# Patient Record
Sex: Male | Born: 1970 | Hispanic: Yes | Marital: Single | Smoking: Light tobacco smoker
Health system: Southern US, Community
[De-identification: ages and names within clinical notes are randomized; demographics above are authoritative.]

## PROBLEM LIST (undated history)

## (undated) DIAGNOSIS — F419 Anxiety disorder, unspecified: Secondary | ICD-10-CM

## (undated) DIAGNOSIS — B20 Human immunodeficiency virus [HIV] disease: Principal | ICD-10-CM

## (undated) DIAGNOSIS — Z21 Asymptomatic human immunodeficiency virus [HIV] infection status: Secondary | ICD-10-CM

## (undated) DIAGNOSIS — Z227 Latent tuberculosis: Secondary | ICD-10-CM

## (undated) DIAGNOSIS — F4310 Post-traumatic stress disorder, unspecified: Secondary | ICD-10-CM

## (undated) DIAGNOSIS — L290 Pruritus ani: Principal | ICD-10-CM

## (undated) HISTORY — DX: Anxiety disorder, unspecified: F41.9

## (undated) HISTORY — DX: Human immunodeficiency virus (HIV) disease: B20

## (undated) HISTORY — DX: Post-traumatic stress disorder, unspecified: F43.10

## (undated) HISTORY — DX: Asymptomatic human immunodeficiency virus (hiv) infection status: Z21

## (undated) HISTORY — DX: Latent tuberculosis: Z22.7

## (undated) HISTORY — DX: Pruritus ani: L29.0

---

## 2012-09-24 DIAGNOSIS — R21 Rash and other nonspecific skin eruption: Secondary | ICD-10-CM | POA: Insufficient documentation

## 2012-09-24 DIAGNOSIS — B20 Human immunodeficiency virus [HIV] disease: Secondary | ICD-10-CM | POA: Insufficient documentation

## 2012-09-24 DIAGNOSIS — Z21 Asymptomatic human immunodeficiency virus [HIV] infection status: Secondary | ICD-10-CM | POA: Insufficient documentation

## 2013-04-19 DIAGNOSIS — A630 Anogenital (venereal) warts: Secondary | ICD-10-CM | POA: Insufficient documentation

## 2013-04-19 DIAGNOSIS — K648 Other hemorrhoids: Secondary | ICD-10-CM | POA: Insufficient documentation

## 2013-04-19 DIAGNOSIS — Z227 Latent tuberculosis: Secondary | ICD-10-CM | POA: Insufficient documentation

## 2013-08-02 ENCOUNTER — Telehealth: Payer: Self-pay

## 2013-08-02 NOTE — Telephone Encounter (Signed)
Referral received form [facility] stating patient would like to transfer to [place].  No demographic information sent.   Phone number in this medical record is invalid.   I will submit to [employer] [facility] .  Laurell Josephsammy K Alverda Nazzaro, RN

## 2013-08-24 DIAGNOSIS — L282 Other prurigo: Secondary | ICD-10-CM | POA: Insufficient documentation

## 2013-08-24 DIAGNOSIS — Z1389 Encounter for screening for other disorder: Secondary | ICD-10-CM | POA: Insufficient documentation

## 2014-08-29 ENCOUNTER — Other Ambulatory Visit: Payer: Self-pay | Admitting: Infectious Disease

## 2014-08-29 ENCOUNTER — Ambulatory Visit
Admission: RE | Admit: 2014-08-29 | Discharge: 2014-08-29 | Disposition: A | Discharge status: Home / Self Care | Payer: No Typology Code available for payment source | Source: Ambulatory Visit | Attending: Infectious Disease | Admitting: Infectious Disease

## 2014-08-29 DIAGNOSIS — Z111 Encounter for screening for respiratory tuberculosis: Secondary | ICD-10-CM

## 2014-09-21 ENCOUNTER — Telehealth: Payer: Self-pay

## 2014-09-21 NOTE — Telephone Encounter (Signed)
Referral from [facility] . Patient would like to [facility] .  Insured.  Appointment given for labs.   Laurell Josephsammy K King, RN

## 2014-09-23 ENCOUNTER — Other Ambulatory Visit: Payer: 59

## 2014-09-23 DIAGNOSIS — E785 Hyperlipidemia, unspecified: Secondary | ICD-10-CM

## 2014-09-23 DIAGNOSIS — A64 Unspecified sexually transmitted disease: Secondary | ICD-10-CM

## 2014-09-23 DIAGNOSIS — B20 Human immunodeficiency virus [HIV] disease: Secondary | ICD-10-CM

## 2014-09-23 LAB — T-HELPER CELL (CD4) - (RCID CLINIC ONLY)
CD4 % Helper T Cell: 39 % (ref 33–55)
CD4 T Cell Abs: 610 /uL (ref 400–2700)

## 2014-09-24 LAB — COMPLETE METABOLIC PANEL WITH GFR
ALT: 35 U/L (ref 0–53)
AST: 28 U/L (ref 0–37)
Albumin: 4.4 g/dL (ref 3.5–5.2)
Alkaline Phosphatase: 82 U/L (ref 39–117)
BUN: 18 mg/dL (ref 6–23)
CO2: 28 mEq/L (ref 19–32)
Calcium: 9.5 mg/dL (ref 8.4–10.5)
Chloride: 102 mEq/L (ref 96–112)
Creat: 1 mg/dL (ref 0.50–1.35)
GFR, Est African American: >89 mL/min
GFR, Est Non African American: >89 mL/min
Glucose, Bld: 122 mg/dL — ABNORMAL HIGH (ref 70–99)
Potassium: 4.1 mEq/L (ref 3.5–5.3)
Sodium: 139 mEq/L (ref 135–145)
Total Bilirubin: 0.5 mg/dL (ref 0.2–1.2)
Total Protein: 7.2 g/dL (ref 6.0–8.3)

## 2014-09-24 LAB — CBC WITH DIFFERENTIAL/PLATELET
Basophils Absolute: 0 10*3/uL (ref 0.0–0.1)
Basophils Relative: 0 % (ref 0–1)
Eosinophils Absolute: 0.1 10*3/uL (ref 0.0–0.7)
Eosinophils Relative: 2 % (ref 0–5)
HCT: 47.2 % (ref 39.0–52.0)
Hemoglobin: 16.2 g/dL (ref 13.0–17.0)
Lymphocytes Relative: 29 % (ref 12–46)
Lymphs Abs: 1.5 10*3/uL (ref 0.7–4.0)
MCH: 32.3 pg (ref 26.0–34.0)
MCHC: 34.3 g/dL (ref 30.0–36.0)
MCV: 94 fL (ref 78.0–100.0)
MPV: 11.7 fL (ref 8.6–12.4)
Monocytes Absolute: 0.3 10*3/uL (ref 0.1–1.0)
Monocytes Relative: 6 % (ref 3–12)
Neutro Abs: 3.3 10*3/uL (ref 1.7–7.7)
Neutrophils Relative %: 63 % (ref 43–77)
Platelets: 202 10*3/uL (ref 150–400)
RBC: 5.02 MIL/uL (ref 4.22–5.81)
RDW: 14.4 % (ref 11.5–15.5)
WBC: 5.2 10*3/uL (ref 4.0–10.5)

## 2014-09-24 LAB — HEPATITIS C ANTIBODY: HCV Ab: NEGATIVE

## 2014-09-24 LAB — HEPATITIS B SURFACE ANTIBODY,QUALITATIVE: Hep B S Ab: NEGATIVE

## 2014-09-24 LAB — URINALYSIS
Bilirubin Urine: NEGATIVE
Glucose, UA: NEGATIVE mg/dL
Hgb urine dipstick: NEGATIVE
Ketones, ur: NEGATIVE mg/dL
Leukocytes, UA: NEGATIVE
Nitrite: NEGATIVE
Protein, ur: NEGATIVE mg/dL
Specific Gravity, Urine: 1.025 (ref 1.005–1.030)
Urobilinogen, UA: 0.2 mg/dL (ref 0.0–1.0)
pH: 6 (ref 5.0–8.0)

## 2014-09-24 LAB — LIPID PANEL
Cholesterol: 185 mg/dL (ref 0–200)
HDL: 39 mg/dL — ABNORMAL LOW (ref >=40)
LDL Cholesterol: 100 mg/dL — ABNORMAL HIGH (ref 0–99)
Total CHOL/HDL Ratio: 4.7 Ratio
Triglycerides: 229 mg/dL — ABNORMAL HIGH (ref <150)
VLDL: 46 mg/dL — ABNORMAL HIGH (ref 0–40)

## 2014-09-24 LAB — HEPATITIS B SURFACE ANTIGEN: Hepatitis B Surface Ag: NEGATIVE

## 2014-09-24 LAB — HEPATITIS A ANTIBODY, TOTAL: Hep A Total Ab: REACTIVE — AB

## 2014-09-24 LAB — RPR

## 2014-09-24 LAB — HEPATITIS B CORE ANTIBODY, TOTAL: Hep B Core Total Ab: NONREACTIVE

## 2014-09-25 LAB — QUANTIFERON TB GOLD ASSAY (BLOOD)
Interferon Gamma Release Assay: POSITIVE — AB
Mitogen value: 6.24 IU/mL
Quantiferon Nil Value: 0.1 IU/mL
Quantiferon Tb Ag Minus Nil Value: 0.46 IU/mL
TB Ag value: 0.56 IU/mL

## 2014-09-26 LAB — URINE CYTOLOGY ANCILLARY ONLY
Chlamydia: NEGATIVE
Neisseria Gonorrhea: NEGATIVE

## 2014-09-26 LAB — HIV-1 RNA ULTRAQUANT REFLEX TO GENTYP+
HIV 1 RNA Quant: <20 copies/mL (ref <20)
HIV-1 RNA Quant, Log: <1.3 {Log} (ref <1.30)

## 2014-09-29 LAB — HLA B*5701: HLA-B*5701 w/rflx HLA-B High: NEGATIVE

## 2014-10-13 ENCOUNTER — Encounter: Payer: Self-pay | Admitting: *Deleted

## 2014-10-13 ENCOUNTER — Encounter: Payer: Self-pay | Admitting: Infectious Disease

## 2014-10-13 ENCOUNTER — Ambulatory Visit (INDEPENDENT_AMBULATORY_CARE_PROVIDER_SITE_OTHER): Payer: 59 | Admitting: Infectious Disease

## 2014-10-13 VITALS — BP 137/77 | HR 84 | Temp 98.1°F | Ht 69.0 in | Wt 160.1 lb

## 2014-10-13 DIAGNOSIS — R7611 Nonspecific reaction to tuberculin skin test without active tuberculosis: Secondary | ICD-10-CM | POA: Diagnosis not present

## 2014-10-13 DIAGNOSIS — Z21 Asymptomatic human immunodeficiency virus [HIV] infection status: Secondary | ICD-10-CM

## 2014-10-13 DIAGNOSIS — B20 Human immunodeficiency virus [HIV] disease: Secondary | ICD-10-CM

## 2014-10-13 DIAGNOSIS — Z227 Latent tuberculosis: Secondary | ICD-10-CM

## 2014-10-13 HISTORY — DX: Latent tuberculosis: Z22.7

## 2014-10-13 MED ORDER — ELVITEG-COBIC-EMTRICIT-TENOFAF 150-150-200-10 MG PO TABS: 1.0000 | ORAL_TABLET | Freq: Every day | ORAL | Status: DC

## 2014-10-13 NOTE — Progress Notes (Signed)
   Subjective:    Patient ID: Ricky Holt, male    DOB: 1970-08-02, 44 y.o.   MRN: [id number]  HPI Ricky Holt is a 44 y/o male with HIV (dx in 2002) who is here to transfer care [facility]. He has been taking Stribild for the past two years and has tolerated this well. His labs from May show that he is undetectable with a CD4 count of 610. His testing on intake into our clinic revealed a positive gold quantiferon TB test. He states he is aware of this and has been treated for this by [place] [facility]. He denies any symptoms of TB and had sputum tests and a chest xray in [place] in October after his TB treatment as part of his Visa requirements. His partner [name] states all of these tests were negative.    Review of Systems  Constitutional: Negative for fever, chills, fatigue and unexpected weight change.  HENT: Negative.   Eyes: Negative.   Respiratory: Negative for cough and shortness of breath.   Gastrointestinal: Negative.   Endocrine: Negative.   Genitourinary: Negative.   Musculoskeletal: Negative.   Allergic/Immunologic: Negative.   Neurological: Negative.   Psychiatric/Behavioral: Negative for suicidal ideas.       Objective:   Physical Exam  Lab Results  Component Value Date   CD4TABS 610 09/23/2014     Lab Results  Component Value Date   HIV1RNAQUANT <20 09/23/2014      Assessment & Plan:  HIV:  Will call in script for Genvoya. If insurance denies this he will continue with Stribild. Follow up in 3 months   Latent TB: [facility] No symptoms of active TB

## 2014-10-13 NOTE — Progress Notes (Signed)
   Subjective:    Patient ID: Ricky Holt, male    DOB: 1971/03/27, 44 y.o.   MRN: [id number]  HPI  For details please see NP Student Lauren O'Neill's note.  Ricky Holt is 44 yo husband of one of my other patients. [place] and wishes to transfer his care here. He was followed for greater than 13 years [facility].  [facility] he was on [place] and [place]uvada and he has record of extremely high compliance.  He has prior PPD + and hx of rx for LTB by [facility] but having difficulties after having come back from [place] per pt and husband with being forced to stay in [place] until 3 sputa negative and now being tested by [facility] again with QF gold?? And called over phone.   Review of Systems  Constitutional: Negative for fever, chills, diaphoresis, activity change, appetite change, fatigue and unexpected weight change.  HENT: Negative for congestion, rhinorrhea, sinus pressure, sneezing, sore throat and trouble swallowing.   Eyes: Negative for photophobia and visual disturbance.  Respiratory: Negative for cough, chest tightness, shortness of breath, wheezing and stridor.   Cardiovascular: Negative for chest pain, palpitations and leg swelling.  Gastrointestinal: Negative for nausea, vomiting, abdominal pain, diarrhea, constipation, blood in stool, abdominal distention and anal bleeding.  Genitourinary: Negative for dysuria, hematuria, flank pain and difficulty urinating.  Musculoskeletal: Negative for myalgias, back pain, joint swelling, arthralgias and gait problem.  Skin: Negative for color change, pallor, rash and wound.  Neurological: Negative for dizziness, tremors, weakness and light-headedness.  Hematological: Negative for adenopathy. Does not bruise/bleed easily.  Psychiatric/Behavioral: Negative for behavioral problems, confusion, sleep disturbance, dysphoric mood, decreased concentration and agitation.       Objective:   Physical Exam  Constitutional: He is oriented to person, place, and time. He appears well-developed and well-nourished.  HENT:  Head: Normocephalic and atraumatic.  Eyes: Conjunctivae and EOM are normal.  Neck: Normal range of motion. Neck supple.  Cardiovascular: Normal rate and regular rhythm.   Pulmonary/Chest: Effort normal. No respiratory distress. He has no wheezes.  Abdominal: Soft. He exhibits no distension.  Musculoskeletal: Normal range of motion. He exhibits no edema or tenderness.  Neurological: He is alert and oriented to person, place, and time.  Skin: Skin is warm and dry. No rash noted. No erythema. No pallor.  Psychiatric: He has a normal mood and affect. His behavior is normal. Judgment and thought content normal.          Assessment & Plan:   HIV: change to Genvoya if is covered by his insurance  LTB: has been treated. I dont understand why anywone would EVER recheck QF gold on him  We spent greater than 45  minutes with the patient including greater than 50% of time in face to face counsel of the patient re his HIV and LTB and in coordination of their care.

## 2014-11-29 ENCOUNTER — Encounter (HOSPITAL_COMMUNITY): Payer: Self-pay | Admitting: Emergency Medicine

## 2014-11-29 ENCOUNTER — Emergency Department (HOSPITAL_COMMUNITY)
Admission: EM | Admit: 2014-11-29 | Discharge: 2014-11-29 | Disposition: A | Discharge status: Home / Self Care | Payer: 59 | Source: Home / Self Care | Attending: Family Medicine | Admitting: Family Medicine

## 2014-11-29 ENCOUNTER — Emergency Department (INDEPENDENT_AMBULATORY_CARE_PROVIDER_SITE_OTHER): Payer: 59

## 2014-11-29 DIAGNOSIS — J069 Acute upper respiratory infection, unspecified: Secondary | ICD-10-CM

## 2014-11-29 HISTORY — DX: Asymptomatic human immunodeficiency virus (hiv) infection status: Z21

## 2014-11-29 HISTORY — DX: Human immunodeficiency virus (HIV) disease: B20

## 2014-11-29 MED ORDER — AZITHROMYCIN 250 MG PO TABS: ORAL_TABLET | ORAL | Status: DC

## 2014-11-29 NOTE — ED Notes (Addendum)
Delay in triage assessment secondary to department acuity.

## 2014-11-29 NOTE — ED Notes (Signed)
Patient transported to X-ray 

## 2014-11-29 NOTE — Discharge Instructions (Signed)
Drink plenty of fluids as discussed, use medicine as prescribed, and mucinex or delsym for cough. Return or see your doctor if further problems, see your doctor in 3 mos for repeat chest x-ray.

## 2014-11-29 NOTE — ED Provider Notes (Signed)
CSN: [id number]     Arrival date & time 11/29/14  1308 History   First MD Initiated Contact with Patient 11/29/14 1331     Chief Complaint  Patient presents with  . Cough   (Consider location/radiation/quality/duration/timing/severity/associated sxs/prior Treatment) Patient is a 44 y.o. male presenting with URI. The history is provided by the patient.  URI Presenting symptoms: congestion, cough, fever and rhinorrhea   Severity:  Moderate Progression:  Unchanged Chronicity:  New Associated symptoms: no wheezing     Past Medical History  Diagnosis Date  . 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 HIV infection   . HIV (human immunodeficiency virus infection)    History reviewed. No pertinent past surgical history. No family history on file. History  Substance Use Topics  . Smoking status: Never Smoker   . Smokeless tobacco: Not on file  . Alcohol Use: 0.0 oz/week    0 Standard drinks or equivalent per week    Review of Systems  Constitutional: Positive for fever.  HENT: Positive for congestion and rhinorrhea.   Respiratory: Positive for cough. Negative for shortness of breath and wheezing.     Allergies  Review of patient's allergies indicates no known allergies.  Home Medications   Prior to Admission medications   Medication Sig Start Date End Date Taking? Authorizing Provider  Efavirenz (SUSTIVA PO) Take by mouth.   Yes Historical Provider, MD  OVER THE COUNTER MEDICATION otc cold medicine   Yes Historical Provider, MD  ValACYclovir HCl (VALTREX PO) Take by mouth.   Yes Historical Provider, MD  azithromycin (ZITHROMAX Z-PAK) 250 MG tablet Take as directed on pack 11/29/14   Linna HoffJames D Shivan Hodes, MD  elvitegravir-cobicistat-emtricitabine-tenofovir (GENVOYA) 150-150-200-10 MG TABS tablet Take 1 tablet by mouth daily with breakfast. 10/13/14   Randall Hissornelius N Van Dam, MD  multivitamin-iron-minerals-folic acid (CENTRUM) chewable tablet Chew 1 tablet by mouth daily.    Historical Provider, MD   omeprazole (PRILOSEC) 20 MG capsule Take 20 mg by mouth daily.    Historical Provider, MD  valACYclovir (VALTREX) 500 MG tablet Take 500 mg by mouth. 03/07/14   Historical Provider, MD   BP 118/73 mmHg  Pulse 73  Temp(Src) 99.6 F (37.6 C) (Oral)  Resp 16  SpO2 97% Physical Exam  Constitutional: He is oriented to person, place, and time. He appears well-developed and well-nourished.  HENT:  Mouth/Throat: Oropharynx is clear and moist.  Eyes: Conjunctivae are normal. Pupils are equal, round, and reactive to light.  Neck: Normal range of motion. Neck supple.  Cardiovascular: Normal rate, normal heart sounds and intact distal pulses.   Pulmonary/Chest: Effort normal and breath sounds normal. He has no rales.  Lymphadenopathy:    He has no cervical adenopathy.  Neurological: He is alert and oriented to person, place, and time.  Skin: Skin is warm and dry.  Nursing note and vitals reviewed.   ED Course  Procedures (including critical care time) Labs Review Labs Reviewed - No data to display  Imaging Review Dg Chest 2 View  11/29/2014   CLINICAL DATA:  Dry cough for 3 days and fever.  Smoking history.  EXAM: CHEST  2 VIEW  COMPARISON:  08/29/2014  FINDINGS: The cardiac silhouette, mediastinal and hilar contours are stable. Asymmetric density in the right infrahilar region may be overlying vascular markings. The lungs are clear. No pleural effusion. The bony thorax is intact.  IMPRESSION: 1. No acute cardiopulmonary findings. 2. Slight prominence of the right infrahilar region not significantly changed since April but I would  recommend a follow-up chest x-ray in 3-4 months to reassess this.   Electronically Signed   By: Rudie Meyer M.D.   On: 11/29/2014 14:48    X-rays reviewed and report per radiologist.  MDM   1. URI (upper respiratory infection)        Linna Hoff, MD 11/30/14 2029

## 2014-11-29 NOTE — ED Notes (Addendum)
Patient has a cough since 7/5.  Has been taking otc cold medicines

## 2014-11-30 ENCOUNTER — Encounter (HOSPITAL_COMMUNITY): Payer: Self-pay | Admitting: Emergency Medicine

## 2015-01-15 ENCOUNTER — Ambulatory Visit (INDEPENDENT_AMBULATORY_CARE_PROVIDER_SITE_OTHER): Payer: 59

## 2015-01-15 ENCOUNTER — Ambulatory Visit (INDEPENDENT_AMBULATORY_CARE_PROVIDER_SITE_OTHER): Payer: 59 | Admitting: Emergency Medicine

## 2015-01-15 VITALS — BP 118/72 | HR 76 | Temp 98.0°F | Resp 16 | Ht 70.0 in | Wt 155.2 lb

## 2015-01-15 DIAGNOSIS — R05 Cough: Secondary | ICD-10-CM | POA: Diagnosis not present

## 2015-01-15 DIAGNOSIS — J189 Pneumonia, unspecified organism: Secondary | ICD-10-CM | POA: Diagnosis not present

## 2015-01-15 DIAGNOSIS — R058 Other specified cough: Secondary | ICD-10-CM

## 2015-01-15 MED ORDER — HYDROCOD POLST-CPM POLST ER 10-8 MG/5ML PO SUER
5.0000 mL | Freq: Every evening | ORAL | Status: DC | PRN
Start: 2015-01-15 — End: 2015-02-08

## 2015-01-15 MED ORDER — BENZONATATE 100 MG PO CAPS: 100.0000 mg | ORAL_CAPSULE | Freq: Three times a day (TID) | ORAL | Status: DC | PRN

## 2015-01-15 MED ORDER — LEVOFLOXACIN 500 MG PO TABS: 500.0000 mg | ORAL_TABLET | Freq: Every day | ORAL | Status: AC

## 2015-01-15 NOTE — Progress Notes (Signed)
[facility] [address] [place] [address] [phone]  Date:  01/15/2015   Name:  Ricky Holt   DOB:  June 28, 1970   MRN:  [id number]  PCP:  No primary care provider on file.    History of Present Illness:  Lee Kuang is a 44 y.o. male patient with PMH of latent tuberculosis and HIV who presents to [facility] with chief complaint of cough for about 1 week.  It is a productive green sputum.  He initially had sorethroat, rhinorrhea of clear sputum, and some nasal congestion.  He has no fever, bodyaches, or fatigue.  He only has dyspnea with coughing fits, but otherwise normal respiratory.  No ear discomfort.  No sick contacts.  No abdominal pain, nausea, or vomiting.   Treated by HD for tuberculosis 8 years ago.   Last cd4=610, with quant at <20.  He is compliant on his medication.         Patient Active Problem List   Diagnosis Date Noted  . 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 Multiphasic screening 08/24/2013  . 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 Hemorrhoids, internal 04/19/2013  . 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 HIV (human immunodeficiency virus infection) 09/24/2012  . Cutaneous eruption 09/24/2012    Past Medical History  Diagnosis Date  . 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 HIV infection   . HIV (human immunodeficiency virus infection)     History reviewed. No pertinent past surgical history.  Social History  Substance Use Topics  . Smoking status: Never Smoker   . Smokeless tobacco: None  . Alcohol Use: 0.0 oz/week    0 Standard drinks or equivalent per week    History reviewed. No pertinent family history.  No Known Allergies  Medication list has been reviewed and updated.  Current Outpatient Prescriptions on File Prior to Visit  Medication Sig Dispense Refill  . elvitegravir-cobicistat-emtricitabine-tenofovir (GENVOYA) 150-150-200-10 MG TABS tablet Take 1 tablet by  mouth daily with breakfast. 30 tablet 11  . multivitamin-iron-minerals-folic acid (CENTRUM) chewable tablet Chew 1 tablet by mouth daily.    Marland Kitchen OVER THE COUNTER MEDICATION otc cold medicine    . ValACYclovir HCl (VALTREX PO) Take by mouth.     No current facility-administered medications on file prior to visit.    ROS ROS otherwise unremarkable unless listed above.  Physical Examination: BP 118/72 mmHg  Pulse 76  Temp(Src) 98 F (36.7 C) (Oral)  Resp 16  Ht 5\' 10"  (1.778 m)  Wt 155 lb 3.2 oz (70.398 kg)  BMI 22.27 kg/m2  SpO2 76% Ideal Body Weight: Weight in (lb) to have BMI = 25: 173.9  Physical Exam  Constitutional: He is oriented to person, place, and time. He appears well-developed and well-nourished. No distress.  HENT:  Head: Normocephalic and atraumatic.  Right Ear: Tympanic membrane, external ear and ear canal normal.  Left Ear: Tympanic membrane, external ear and ear canal normal.  Eyes: Conjunctivae are normal. Right eye exhibits no discharge. Left eye exhibits no discharge. No scleral icterus.  Cardiovascular: Normal rate, regular rhythm and intact distal pulses.  Exam reveals no friction rub.   No murmur heard. Pulmonary/Chest: Effort normal. No respiratory distress. He has wheezes (Very mild wheezng at end of expiration in the left lower base, but otherwise good air circulation).  Abdominal: Soft.  Lymphadenopathy:    He has no cervical adenopathy.  Neurological: He is alert and oriented to person, place, and time.  Skin:  Skin is warm and dry. He is not diaphoretic.  Psychiatric: He has a normal mood and affect. His behavior is normal.    [facility] reading (PRIMARY) by  Dr. Cleta Alberts: Increased markings.  Patchy infiltrates.   Assessment and Plan: 44 year old male is here today for chief complaint of productive cough.  Treating for cap with levaquin given co-mords.  Treating cough supportively.  Advised hydration and otc mucinex.   CAP (community acquired pneumonia) -  Plan: levofloxacin (LEVAQUIN) 500 MG tablet, chlorpheniramine-HYDROcodone (TUSSIONEX PENNKINETIC ER) 10-8 MG/5ML SUER  Productive cough - Plan: DG Chest 2 View, benzonatate (TESSALON) 100 MG capsule  Trena Platt, PA-C [facility] 8/29/201610:32 PM

## 2015-01-15 NOTE — Patient Instructions (Signed)

## 2015-01-25 ENCOUNTER — Other Ambulatory Visit: Payer: 59

## 2015-01-25 DIAGNOSIS — B20 Human immunodeficiency virus [HIV] disease: Secondary | ICD-10-CM

## 2015-01-25 DIAGNOSIS — Z21 Asymptomatic human immunodeficiency virus [HIV] infection status: Secondary | ICD-10-CM

## 2015-01-25 LAB — CBC WITH DIFFERENTIAL/PLATELET
Basophils Absolute: 0.1 10*3/uL (ref 0.0–0.1)
Basophils Relative: 1 % (ref 0–1)
Eosinophils Absolute: 0.2 10*3/uL (ref 0.0–0.7)
Eosinophils Relative: 3 % (ref 0–5)
HCT: 46.7 % (ref 39.0–52.0)
Hemoglobin: 16.5 g/dL (ref 13.0–17.0)
Lymphocytes Relative: 29 % (ref 12–46)
Lymphs Abs: 1.7 10*3/uL (ref 0.7–4.0)
MCH: 33.2 pg (ref 26.0–34.0)
MCHC: 35.3 g/dL (ref 30.0–36.0)
MCV: 94 fL (ref 78.0–100.0)
MPV: 11.9 fL (ref 8.6–12.4)
Monocytes Absolute: 0.4 10*3/uL (ref 0.1–1.0)
Monocytes Relative: 7 % (ref 3–12)
Neutro Abs: 3.4 10*3/uL (ref 1.7–7.7)
Neutrophils Relative %: 60 % (ref 43–77)
Platelets: 214 10*3/uL (ref 150–400)
RBC: 4.97 MIL/uL (ref 4.22–5.81)
RDW: 14.8 % (ref 11.5–15.5)
WBC: 5.7 10*3/uL (ref 4.0–10.5)

## 2015-01-25 LAB — COMPLETE METABOLIC PANEL WITH GFR
ALT: 17 U/L (ref 9–46)
AST: 20 U/L (ref 10–40)
Albumin: 4.3 g/dL (ref 3.6–5.1)
Alkaline Phosphatase: 80 U/L (ref 40–115)
BUN: 17 mg/dL (ref 7–25)
CO2: 25 mmol/L (ref 20–31)
Calcium: 9.6 mg/dL (ref 8.6–10.3)
Chloride: 102 mmol/L (ref 98–110)
Creat: 0.94 mg/dL (ref 0.60–1.35)
GFR, Est African American: >89 mL/min (ref >=60)
GFR, Est Non African American: >89 mL/min (ref >=60)
Glucose, Bld: 159 mg/dL — ABNORMAL HIGH (ref 65–99)
Potassium: 4.4 mmol/L (ref 3.5–5.3)
Sodium: 140 mmol/L (ref 135–146)
Total Bilirubin: 0.4 mg/dL (ref 0.2–1.2)
Total Protein: 6.7 g/dL (ref 6.1–8.1)

## 2015-01-25 LAB — LIPID PANEL
Cholesterol: 255 mg/dL — ABNORMAL HIGH (ref 125–200)
HDL: 33 mg/dL — ABNORMAL LOW (ref >=40)
Total CHOL/HDL Ratio: 7.7 Ratio — ABNORMAL HIGH (ref <=5.0)
Triglycerides: 1190 mg/dL — ABNORMAL HIGH (ref <150)

## 2015-01-26 LAB — T-HELPER CELL (CD4) - (RCID CLINIC ONLY)
CD4 % Helper T Cell: 41 % (ref 33–55)
CD4 T Cell Abs: 740 /uL (ref 400–2700)

## 2015-01-26 LAB — RPR

## 2015-01-29 LAB — HIV-1 RNA QUANT-NO REFLEX-BLD
HIV 1 RNA Quant: <20 copies/mL (ref <20)
HIV-1 RNA Quant, Log: <1.3 {Log} (ref <1.30)

## 2015-02-08 ENCOUNTER — Ambulatory Visit (INDEPENDENT_AMBULATORY_CARE_PROVIDER_SITE_OTHER): Payer: 59 | Admitting: Infectious Disease

## 2015-02-08 ENCOUNTER — Encounter: Payer: Self-pay | Admitting: Infectious Disease

## 2015-02-08 VITALS — BP 127/87 | HR 75 | Temp 98.5°F | Wt 159.0 lb

## 2015-02-08 DIAGNOSIS — A630 Anogenital (venereal) warts: Secondary | ICD-10-CM | POA: Diagnosis not present

## 2015-02-08 DIAGNOSIS — Z23 Encounter for immunization: Secondary | ICD-10-CM | POA: Diagnosis not present

## 2015-02-08 DIAGNOSIS — Z21 Asymptomatic human immunodeficiency virus [HIV] infection status: Secondary | ICD-10-CM | POA: Diagnosis not present

## 2015-02-08 DIAGNOSIS — L290 Pruritus ani: Secondary | ICD-10-CM | POA: Diagnosis not present

## 2015-02-08 DIAGNOSIS — K648 Other hemorrhoids: Secondary | ICD-10-CM

## 2015-02-08 DIAGNOSIS — B20 Human immunodeficiency virus [HIV] disease: Secondary | ICD-10-CM

## 2015-02-08 DIAGNOSIS — R7611 Nonspecific reaction to tuberculin skin test without active tuberculosis: Secondary | ICD-10-CM | POA: Diagnosis not present

## 2015-02-08 DIAGNOSIS — Z227 Latent tuberculosis: Secondary | ICD-10-CM

## 2015-02-08 HISTORY — DX: Pruritus ani: L29.0

## 2015-02-08 MED ORDER — HYDROCORTISONE ACETATE 25 MG RE SUPP: 25.0000 mg | Freq: Two times a day (BID) | RECTAL | Status: DC

## 2015-02-08 NOTE — Progress Notes (Signed)
Chief complaint: itching in his anal and perianal area  Subjective:    Patient ID: Ricky Holt, male    DOB: 1971/04/22, 44 y.o.   MRN: [id number]  HPI  Ricky Holt is 46 yo husband of one of my other patients. He has moved to [facility] and wishes to transfer his care here. He was followed for greater than 13 years by Ricky Holt at [place].  Prior to switch to [facility] he was on [place] and he has record of extremely high compliance.  He has prior PPD + and hx of rx for LTB by [facility]. We changed him to [facility] but I am not sure if he is on this or STRIBILD.  He returns to clinic for followup. 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 He has has no discharge from rectum. He has no rectal pain. He has tried several OTC meds including steroids without reflief.  Past Medical History  Diagnosis Date  . 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 HIV infection   . HIV (human immunodeficiency virus infection)     No past surgical history on file.  No family history on file. No hx of connective tissue disease or IBD  Social History  Substance Use Topics  . Smoking status: Light Tobacco Smoker    Types: Cigarettes  . Smokeless tobacco: None     Comment: 1 cigarette daily  . Alcohol Use: 0.0 oz/week    0 Standard drinks or equivalent per week   No Known Allergies   Current outpatient prescriptions:  .  elvitegravir-cobicistat-emtricitabine-tenofovir (GENVOYA) 150-150-200-10 MG TABS tablet, Take 1 tablet by mouth daily with breakfast., Disp: 30 tablet, Rfl: 11 .  multivitamin-iron-minerals-folic acid (CENTRUM) chewable tablet, Chew 1 tablet by mouth daily., Disp: , Rfl:  .  ranitidine (ZANTAC) 75 MG tablet, Take 75 mg by mouth 2 (two) times daily., Disp: , Rfl:  .  ValACYclovir HCl (VALTREX PO), Take by mouth., Disp: , Rfl:  .  hydrocortisone (ANUSOL-HC) 25 MG suppository, Place 1  suppository (25 mg total) rectally 2 (two) times daily., Disp: 12 suppository, Rfl: 1   Review of Systems  Constitutional: Negative for fever, chills, diaphoresis, activity change, appetite change, fatigue and unexpected weight change.  HENT: Negative for congestion, rhinorrhea, sinus pressure, sneezing, sore throat and trouble swallowing.   Eyes: Negative for photophobia and visual disturbance.  Respiratory: Negative for cough, chest tightness, shortness of breath, wheezing and stridor.   Cardiovascular: Negative for chest pain, palpitations and leg swelling.  Gastrointestinal: Negative for nausea, vomiting, abdominal pain, diarrhea, constipation, blood in stool, abdominal distention and anal bleeding.  Genitourinary: Negative for dysuria, urgency, hematuria, flank pain, decreased urine volume, discharge, penile swelling, scrotal swelling, difficulty urinating, penile pain and testicular pain.  Musculoskeletal: Negative for myalgias, back pain, joint swelling, arthralgias and gait problem.  Skin: Positive for color change. Negative for pallor, rash and wound.  Neurological: Negative for dizziness, tremors, weakness and light-headedness.  Hematological: Negative for adenopathy. Does not bruise/bleed easily.  Psychiatric/Behavioral: Negative for behavioral problems, confusion, sleep disturbance, dysphoric mood, decreased concentration and agitation.       Objective:   Physical Exam  Constitutional: He is oriented to person, place, and time. He appears well-developed and well-nourished.  HENT:  Head: Normocephalic and atraumatic.  Eyes: Conjunctivae and EOM are normal.  Neck: Normal range of motion. Neck supple.  Cardiovascular: Normal rate and regular rhythm.   Pulmonary/Chest: Effort  normal. No respiratory distress. He has no wheezes.  Abdominal: Soft. He exhibits no distension.  Genitourinary: Rectal exam shows external hemorrhoid.     Musculoskeletal: Normal range of motion. He  exhibits no edema or tenderness.  Neurological: He is alert and oriented to person, place, and time.  Skin: Skin is warm and dry. No rash noted. No erythema. No pallor.  Psychiatric: He has a normal mood and affect. His behavior is normal. Judgment and thought content normal.          Assessment & Plan:   New problem of perirectal pruritis: could be yeast infection in this area prone to moisture where he also sweats during work. Could also be an STD and therefore will check GC and chlamydia probe  HIV disease: perfect compliance, labs excellent, RTC in 4 months  LTB: sp treatment  I spent greater than 25 minutes with the patient including greater than 50% of time in face to face counsel of the patient re his HIV, his ARV regimen, his anal pruritis and in coordination of their care.

## 2015-02-08 NOTE — Patient Instructions (Signed)
Try LOTRIMIN (clotrimazole) antifungal in the area that is itching, twice daily  Not in the rectum  If not better in 1-2 weeks call us back

## 2015-02-09 LAB — CYTOLOGY, (ORAL, ANAL, URETHRAL) ANCILLARY ONLY
Chlamydia: NEGATIVE
Neisseria Gonorrhea: NEGATIVE

## 2015-02-14 ENCOUNTER — Other Ambulatory Visit: Payer: Self-pay | Admitting: Infectious Disease

## 2015-02-14 MED ORDER — VALACYCLOVIR HCL 500 MG PO TABS: 500.0000 mg | ORAL_TABLET | Freq: Every day | ORAL | Status: DC

## 2015-03-28 ENCOUNTER — Encounter (HOSPITAL_COMMUNITY): Payer: Self-pay | Admitting: Emergency Medicine

## 2015-03-28 ENCOUNTER — Observation Stay (HOSPITAL_COMMUNITY)
Admission: EM | Admit: 2015-03-28 | Discharge: 2015-03-29 | Disposition: A | Discharge status: Home / Self Care | Payer: 59 | Attending: General Surgery | Admitting: General Surgery

## 2015-03-28 ENCOUNTER — Emergency Department (HOSPITAL_COMMUNITY): Payer: 59

## 2015-03-28 ENCOUNTER — Observation Stay (HOSPITAL_COMMUNITY): Payer: 59

## 2015-03-28 DIAGNOSIS — F17210 Nicotine dependence, cigarettes, uncomplicated: Secondary | ICD-10-CM | POA: Insufficient documentation

## 2015-03-28 DIAGNOSIS — S0181XA Laceration without foreign body of other part of head, initial encounter: Principal | ICD-10-CM | POA: Diagnosis present

## 2015-03-28 DIAGNOSIS — J342 Deviated nasal septum: Secondary | ICD-10-CM | POA: Insufficient documentation

## 2015-03-28 DIAGNOSIS — M25511 Pain in right shoulder: Secondary | ICD-10-CM | POA: Insufficient documentation

## 2015-03-28 DIAGNOSIS — S02641A Fracture of ramus of right mandible, initial encounter for closed fracture: Secondary | ICD-10-CM | POA: Insufficient documentation

## 2015-03-28 DIAGNOSIS — S0240FA Zygomatic fracture, left side, initial encounter for closed fracture: Secondary | ICD-10-CM | POA: Insufficient documentation

## 2015-03-28 DIAGNOSIS — S0291XA Unspecified fracture of skull, initial encounter for closed fracture: Secondary | ICD-10-CM

## 2015-03-28 DIAGNOSIS — S060X9A Concussion with loss of consciousness of unspecified duration, initial encounter: Secondary | ICD-10-CM | POA: Insufficient documentation

## 2015-03-28 DIAGNOSIS — R51 Headache: Secondary | ICD-10-CM | POA: Insufficient documentation

## 2015-03-28 DIAGNOSIS — Z79899 Other long term (current) drug therapy: Secondary | ICD-10-CM

## 2015-03-28 DIAGNOSIS — S060XAA Concussion with loss of consciousness status unknown, initial encounter: Secondary | ICD-10-CM | POA: Diagnosis present

## 2015-03-28 DIAGNOSIS — Z8611 Personal history of tuberculosis: Secondary | ICD-10-CM | POA: Diagnosis not present

## 2015-03-28 DIAGNOSIS — B20 Human immunodeficiency virus [HIV] disease: Secondary | ICD-10-CM | POA: Insufficient documentation

## 2015-03-28 DIAGNOSIS — S0081XA Abrasion of other part of head, initial encounter: Secondary | ICD-10-CM | POA: Insufficient documentation

## 2015-03-28 DIAGNOSIS — S0282XA Fracture of other specified skull and facial bones, left side, initial encounter for closed fracture: Secondary | ICD-10-CM | POA: Insufficient documentation

## 2015-03-28 DIAGNOSIS — M25512 Pain in left shoulder: Secondary | ICD-10-CM

## 2015-03-28 DIAGNOSIS — S0292XA Unspecified fracture of facial bones, initial encounter for closed fracture: Secondary | ICD-10-CM | POA: Diagnosis present

## 2015-03-28 LAB — I-STAT CHEM 8, ED
BUN: 18 mg/dL (ref 6–20)
Calcium, Ion: 1.12 mmol/L (ref 1.12–1.23)
Chloride: 100 mmol/L — ABNORMAL LOW (ref 101–111)
Creatinine, Ser: 0.9 mg/dL (ref 0.61–1.24)
Glucose, Bld: 149 mg/dL — ABNORMAL HIGH (ref 65–99)
HCT: 52 % (ref 39.0–52.0)
Hemoglobin: 17.7 g/dL — ABNORMAL HIGH (ref 13.0–17.0)
Potassium: 3.7 mmol/L (ref 3.5–5.1)
Sodium: 140 mmol/L (ref 135–145)
TCO2: 23 mmol/L (ref 0–100)

## 2015-03-28 LAB — CBC WITH DIFFERENTIAL/PLATELET
Basophils Absolute: 0 10*3/uL (ref 0.0–0.1)
Basophils Relative: 0 %
Eosinophils Absolute: 0.2 10*3/uL (ref 0.0–0.7)
Eosinophils Relative: 2 %
HCT: 47.4 % (ref 39.0–52.0)
Hemoglobin: 17.1 g/dL — ABNORMAL HIGH (ref 13.0–17.0)
Lymphocytes Relative: 34 %
Lymphs Abs: 2.5 10*3/uL (ref 0.7–4.0)
MCH: 33.5 pg (ref 26.0–34.0)
MCHC: 36.1 g/dL — ABNORMAL HIGH (ref 30.0–36.0)
MCV: 92.9 fL (ref 78.0–100.0)
Monocytes Absolute: 0.5 10*3/uL (ref 0.1–1.0)
Monocytes Relative: 6 %
Neutro Abs: 4.1 10*3/uL (ref 1.7–7.7)
Neutrophils Relative %: 58 %
Platelets: 125 10*3/uL — ABNORMAL LOW (ref 150–400)
RBC: 5.1 MIL/uL (ref 4.22–5.81)
RDW: 13.5 % (ref 11.5–15.5)
WBC: 7.2 10*3/uL (ref 4.0–10.5)

## 2015-03-28 LAB — ETHANOL: Alcohol, Ethyl (B): 72 mg/dL — ABNORMAL HIGH (ref <5)

## 2015-03-28 MED ORDER — HYDROCORTISONE ACETATE 25 MG RE SUPP
25.0000 mg | Freq: Two times a day (BID) | RECTAL | Status: DC
  Administered 2015-03-28 – 2015-03-29 (×3): 25 mg via RECTAL
  Filled 2015-03-28 (×3): qty 1

## 2015-03-28 MED ORDER — TETANUS-DIPHTH-ACELL PERTUSSIS 5-2.5-18.5 LF-MCG/0.5 IM SUSP
0.5000 mL | Freq: Once | INTRAMUSCULAR | Status: AC
  Administered 2015-03-28: 0.5 mL via INTRAMUSCULAR
  Filled 2015-03-28: qty 0.5

## 2015-03-28 MED ORDER — FAMOTIDINE 10 MG PO TABS
10.0000 mg | ORAL_TABLET | Freq: Two times a day (BID) | ORAL | Status: DC
  Administered 2015-03-28 – 2015-03-29 (×3): 10 mg via ORAL
  Filled 2015-03-28 (×4): qty 1

## 2015-03-28 MED ORDER — HYDROCODONE-ACETAMINOPHEN 7.5-325 MG/15ML PO SOLN
15.0000 mL | ORAL | Status: DC | PRN
  Administered 2015-03-28: 15 mL via ORAL
  Filled 2015-03-28: qty 15

## 2015-03-28 MED ORDER — ONDANSETRON HCL 4 MG/2ML IJ SOLN: 4.0000 mg | Freq: Four times a day (QID) | INTRAMUSCULAR | Status: DC | PRN

## 2015-03-28 MED ORDER — ENOXAPARIN SODIUM 40 MG/0.4ML ~~LOC~~ SOLN
40.0000 mg | SUBCUTANEOUS | Status: DC
  Administered 2015-03-28 – 2015-03-29 (×2): 40 mg via SUBCUTANEOUS
  Filled 2015-03-28: qty 0.4

## 2015-03-28 MED ORDER — IOHEXOL 300 MG/ML  SOLN
100.0000 mL | Freq: Once | INTRAMUSCULAR | Status: AC | PRN
  Administered 2015-03-28: 100 mL via INTRAVENOUS

## 2015-03-28 MED ORDER — ONDANSETRON HCL 4 MG PO TABS: 4.0000 mg | ORAL_TABLET | Freq: Four times a day (QID) | ORAL | Status: DC | PRN

## 2015-03-28 MED ORDER — FENTANYL CITRATE (PF) 100 MCG/2ML IJ SOLN
50.0000 ug | Freq: Once | INTRAMUSCULAR | Status: AC
  Administered 2015-03-28: 50 ug via INTRAVENOUS
  Filled 2015-03-28: qty 2

## 2015-03-28 MED ORDER — LIDOCAINE HCL (PF) 1 % IJ SOLN
INTRAMUSCULAR | Status: AC
  Filled 2015-03-28: qty 10

## 2015-03-28 MED ORDER — MORPHINE SULFATE (PF) 2 MG/ML IV SOLN
2.0000 mg | INTRAVENOUS | Status: DC | PRN
  Administered 2015-03-28 – 2015-03-29 (×5): 2 mg via INTRAVENOUS
  Filled 2015-03-28 (×5): qty 1

## 2015-03-28 MED ORDER — CEFAZOLIN SODIUM 1-5 GM-% IV SOLN
1.0000 g | Freq: Once | INTRAVENOUS | Status: AC
  Administered 2015-03-28: 1 g via INTRAVENOUS
  Filled 2015-03-28: qty 50

## 2015-03-28 MED ORDER — ELVITEG-COBIC-EMTRICIT-TENOFAF 150-150-200-10 MG PO TABS: 1.0000 | ORAL_TABLET | Freq: Every day | ORAL | Status: DC

## 2015-03-28 MED ORDER — VALACYCLOVIR HCL 500 MG PO TABS
500.0000 mg | ORAL_TABLET | Freq: Every day | ORAL | Status: DC
  Administered 2015-03-28 – 2015-03-29 (×2): 500 mg via ORAL
  Filled 2015-03-28 (×2): qty 1

## 2015-03-28 MED ORDER — BACITRACIN ZINC 500 UNIT/GM EX OINT
TOPICAL_OINTMENT | Freq: Two times a day (BID) | CUTANEOUS | Status: DC
  Administered 2015-03-28 – 2015-03-29 (×3): via TOPICAL
  Filled 2015-03-28: qty 28.35

## 2015-03-28 MED ORDER — OXYCODONE HCL 5 MG PO TABS
5.0000 mg | ORAL_TABLET | ORAL | Status: DC | PRN
  Administered 2015-03-28: 10 mg via ORAL
  Filled 2015-03-28: qty 2

## 2015-03-28 MED ORDER — MORPHINE SULFATE (PF) 4 MG/ML IV SOLN
4.0000 mg | Freq: Once | INTRAVENOUS | Status: AC
  Administered 2015-03-28: 4 mg via INTRAVENOUS
  Filled 2015-03-28: qty 1

## 2015-03-28 MED ORDER — ELVITEG-COBIC-EMTRICIT-TENOFAF 150-150-200-10 MG PO TABS
1.0000 | ORAL_TABLET | Freq: Once | ORAL | Status: AC
  Administered 2015-03-28: 1 via ORAL
  Filled 2015-03-28: qty 1

## 2015-03-28 MED ORDER — ELVITEG-COBIC-EMTRICIT-TENOFAF 150-150-200-10 MG PO TABS
1.0000 | ORAL_TABLET | Freq: Every day | ORAL | Status: DC
  Administered 2015-03-29: 1 via ORAL
  Filled 2015-03-28 (×2): qty 1

## 2015-03-28 MED ORDER — DEXTROSE 50 % IV SOLN
INTRAVENOUS | Status: AC
Start: 2015-03-28 — End: 2015-03-29
  Filled 2015-03-28: qty 50

## 2015-03-28 MED ORDER — LIDOCAINE HCL (PF) 1 % IJ SOLN
20.0000 mL | Freq: Once | INTRAMUSCULAR | Status: DC
  Filled 2015-03-28 (×2): qty 30

## 2015-03-28 MED ORDER — FENTANYL CITRATE (PF) 100 MCG/2ML IJ SOLN
50.0000 ug | Freq: Once | INTRAMUSCULAR | Status: AC
Start: 2015-03-28 — End: 2015-03-28
  Administered 2015-03-28: 50 ug via INTRAVENOUS
  Filled 2015-03-28: qty 2
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
## 2015-03-28 NOTE — ED Notes (Signed)
Pt okay to ambulate per Dr. Tinnie GensJeffrey
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
## 2015-03-28 NOTE — ED Notes (Signed)
Attempted report x1. 

## 2015-03-28 NOTE — ED Notes (Signed)
Pt is not allowed to ambulate at this time per EDP
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
## 2015-03-28 NOTE — ED Notes (Signed)
Will, PA at bedside to suture.

## 2015-03-28 NOTE — ED Notes (Signed)
Dr. Palumbo at bedside. 
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
## 2015-03-28 NOTE — ED Notes (Signed)
Pt transported to CT ?
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
## 2015-03-28 NOTE — ED Notes (Signed)
Dr. Thompson at bedside. 

## 2015-03-28 NOTE — ED Notes (Signed)
Plastic surgeon at bedside.
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
## 2015-03-28 NOTE — Progress Notes (Signed)
Prn hydrocodone administered for c/o pain
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
## 2015-03-29 MED ORDER — DOCUSATE SODIUM 100 MG PO CAPS
100.0000 mg | ORAL_CAPSULE | Freq: Two times a day (BID) | ORAL | Status: DC
  Administered 2015-03-29: 100 mg via ORAL
  Filled 2015-03-29: qty 1

## 2015-03-29 MED ORDER — HYDROCODONE-ACETAMINOPHEN 7.5-325 MG/15ML PO SOLN: 10.0000 mL | ORAL | Status: DC | PRN

## 2015-03-29 MED ORDER — TRAMADOL HCL 50 MG PO TABS: 100.0000 mg | ORAL_TABLET | Freq: Four times a day (QID) | ORAL | Status: DC

## 2015-03-29 MED ORDER — NAPROXEN 500 MG PO TABS: 500.0000 mg | ORAL_TABLET | Freq: Two times a day (BID) | ORAL | Status: DC

## 2015-03-29 MED ORDER — NAPROXEN 250 MG PO TABS
500.0000 mg | ORAL_TABLET | Freq: Two times a day (BID) | ORAL | Status: DC
Start: 2015-03-29 — End: 2015-03-29
  Administered 2015-03-29: 500 mg via ORAL
  Filled 2015-03-29: qty 2

## 2015-03-29 MED ORDER — HYDROCODONE-ACETAMINOPHEN 7.5-325 MG/15ML PO SOLN
10.0000 mL | ORAL | Status: DC | PRN
  Administered 2015-03-29: 15 mL via ORAL
  Filled 2015-03-29: qty 30

## 2015-03-29 MED ORDER — POLYETHYLENE GLYCOL 3350 17 G PO PACK
17.0000 g | PACK | Freq: Every day | ORAL | Status: DC
  Administered 2015-03-29: 17 g via ORAL
  Filled 2015-03-29: qty 1

## 2015-03-29 MED ORDER — TRAMADOL HCL 50 MG PO TABS
100.0000 mg | ORAL_TABLET | Freq: Four times a day (QID) | ORAL | Status: DC
  Administered 2015-03-29 (×2): 100 mg via ORAL
  Filled 2015-03-29 (×2): qty 2
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
## 2015-03-29 NOTE — Progress Notes (Signed)
Discharge paperwork given to patient. Patient ready for discharge.
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
## 2015-03-29 NOTE — Discharge Instructions (Signed)
Wash wounds daily in shower with soap and water. Do not soak. Apply antibiotic ointment (e.g. Neosporin) twice daily and as needed to keep moist. Cover with dry dressing if desired.  No driving while taking hydrocodone.

## 2015-04-10 ENCOUNTER — Telehealth: Payer: Self-pay | Admitting: *Deleted

## 2015-04-10 NOTE — Telephone Encounter (Signed)
Needing return lab and MD appts.  Requested pt call [facility] for lab and return appt with Dr. Daiva EvesVan Dam for January, 2017.

## 2015-05-03 ENCOUNTER — Telehealth: Payer: Self-pay | Admitting: *Deleted
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
## 2015-05-04 ENCOUNTER — Other Ambulatory Visit: Payer: Self-pay | Admitting: *Deleted

## 2015-05-04 ENCOUNTER — Ambulatory Visit: Payer: 59 | Admitting: *Deleted

## 2015-05-04 DIAGNOSIS — F409 Phobic anxiety disorder, unspecified: Secondary | ICD-10-CM

## 2015-05-04 DIAGNOSIS — F4310 Post-traumatic stress disorder, unspecified: Secondary | ICD-10-CM

## 2015-05-04 MED ORDER — VALACYCLOVIR HCL 500 MG PO TABS: 500.0000 mg | ORAL_TABLET | Freq: Every day | ORAL | Status: DC

## 2015-05-04 NOTE — Telephone Encounter (Signed)
He is coming for lab work 1/11, but will see Lennox LaityJodi 12/21 (has surgery scheduled 12/22).  Would you like me to have him come back for labs now?

## 2015-05-04 NOTE — Telephone Encounter (Signed)
Good idea to have his labs repeated agreed

## 2015-05-04 NOTE — Telephone Encounter (Signed)
i am ok with that but have we also checked an RPR or any other STD tests on him recently this could be syphilis  Very sorry to hear about this

## 2015-05-04 NOTE — Telephone Encounter (Signed)
Patient did come in, did speak with Ricky Holt, will follow up next week. He asked to speak with a nurse about an itchy, dry rash he developed on his buttocks after going to the gym (used the sauna).  Please advise if triamcinolone cream would be appropriate. No induration, no vesicles, no erythema.
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
## 2015-05-05 ENCOUNTER — Other Ambulatory Visit: Payer: Self-pay | Admitting: *Deleted

## 2015-05-05 MED ORDER — TRIAMCINOLONE ACETONIDE 0.5 % EX OINT: 1.0000 "application " | TOPICAL_OINTMENT | Freq: Two times a day (BID) | CUTANEOUS | Status: DC

## 2015-05-24 ENCOUNTER — Telehealth: Payer: Self-pay | Admitting: Infectious Disease

## 2015-05-24 NOTE — Telephone Encounter (Signed)
[facility] and will need to followp with us in the next few weeks. Does he have ADAP in interim??

## 2015-05-25 NOTE — Telephone Encounter (Signed)
He is insured.

## 2015-05-25 NOTE — Telephone Encounter (Signed)
Very good 

## 2015-05-25 NOTE — Telephone Encounter (Signed)
Follow up scheduled with you 1/30.

## 2015-05-31 ENCOUNTER — Other Ambulatory Visit: Payer: 59

## 2015-06-01 ENCOUNTER — Other Ambulatory Visit: Payer: 59

## 2015-06-01 DIAGNOSIS — B20 Human immunodeficiency virus [HIV] disease: Secondary | ICD-10-CM

## 2015-06-01 LAB — COMPLETE METABOLIC PANEL WITH GFR
ALT: 30 U/L (ref 9–46)
AST: 29 U/L (ref 10–40)
Albumin: 4.2 g/dL (ref 3.6–5.1)
Alkaline Phosphatase: 64 U/L (ref 40–115)
BUN: 16 mg/dL (ref 7–25)
CO2: 28 mmol/L (ref 20–31)
Calcium: 9.2 mg/dL (ref 8.6–10.3)
Chloride: 104 mmol/L (ref 98–110)
Creat: 0.95 mg/dL (ref 0.60–1.35)
GFR, Est African American: >89 mL/min (ref >=60)
GFR, Est Non African American: >89 mL/min (ref >=60)
Glucose, Bld: 127 mg/dL — ABNORMAL HIGH (ref 65–99)
Potassium: 4 mmol/L (ref 3.5–5.3)
Sodium: 141 mmol/L (ref 135–146)
Total Bilirubin: 0.5 mg/dL (ref 0.2–1.2)
Total Protein: 6.8 g/dL (ref 6.1–8.1)

## 2015-06-01 LAB — CBC WITH DIFFERENTIAL/PLATELET
Basophils Absolute: 0.1 10*3/uL (ref 0.0–0.1)
Basophils Relative: 1 % (ref 0–1)
Eosinophils Absolute: 0.1 10*3/uL (ref 0.0–0.7)
Eosinophils Relative: 2 % (ref 0–5)
HCT: 43.5 % (ref 39.0–52.0)
Hemoglobin: 15.2 g/dL (ref 13.0–17.0)
Lymphocytes Relative: 34 % (ref 12–46)
Lymphs Abs: 1.8 10*3/uL (ref 0.7–4.0)
MCH: 32.9 pg (ref 26.0–34.0)
MCHC: 34.9 g/dL (ref 30.0–36.0)
MCV: 94.2 fL (ref 78.0–100.0)
MPV: 11.3 fL (ref 8.6–12.4)
Monocytes Absolute: 0.4 10*3/uL (ref 0.1–1.0)
Monocytes Relative: 8 % (ref 3–12)
Neutro Abs: 3 10*3/uL (ref 1.7–7.7)
Neutrophils Relative %: 55 % (ref 43–77)
Platelets: 201 10*3/uL (ref 150–400)
RBC: 4.62 MIL/uL (ref 4.22–5.81)
RDW: 14.2 % (ref 11.5–15.5)
WBC: 5.4 10*3/uL (ref 4.0–10.5)

## 2015-06-02 LAB — URINE CYTOLOGY ANCILLARY ONLY
Chlamydia: NEGATIVE
Neisseria Gonorrhea: NEGATIVE

## 2015-06-02 LAB — HIV-1 RNA QUANT-NO REFLEX-BLD
HIV 1 RNA Quant: <20 copies/mL (ref <20)
HIV-1 RNA Quant, Log: <1.3 Log copies/mL (ref <1.30)

## 2015-06-02 LAB — RPR

## 2015-06-02 LAB — T-HELPER CELL (CD4) - (RCID CLINIC ONLY)
CD4 % Helper T Cell: 36 % (ref 33–55)
CD4 T Cell Abs: 650 /uL (ref 400–2700)

## 2015-06-19 ENCOUNTER — Encounter: Payer: Self-pay | Admitting: Infectious Disease

## 2015-06-19 ENCOUNTER — Ambulatory Visit: Payer: 59 | Admitting: *Deleted

## 2015-06-19 ENCOUNTER — Ambulatory Visit (INDEPENDENT_AMBULATORY_CARE_PROVIDER_SITE_OTHER): Payer: 59 | Admitting: Infectious Disease

## 2015-06-19 VITALS — BP 128/85 | HR 67 | Temp 97.7°F | Wt 150.0 lb

## 2015-06-19 DIAGNOSIS — S060X0S Concussion without loss of consciousness, sequela: Secondary | ICD-10-CM

## 2015-06-19 DIAGNOSIS — Z21 Asymptomatic human immunodeficiency virus [HIV] infection status: Secondary | ICD-10-CM

## 2015-06-19 DIAGNOSIS — B20 Human immunodeficiency virus [HIV] disease: Secondary | ICD-10-CM | POA: Diagnosis not present

## 2015-06-19 DIAGNOSIS — S0292XD Unspecified fracture of facial bones, subsequent encounter for fracture with routine healing: Secondary | ICD-10-CM

## 2015-06-19 DIAGNOSIS — F419 Anxiety disorder, unspecified: Secondary | ICD-10-CM

## 2015-06-19 DIAGNOSIS — S0181XS Laceration without foreign body of other part of head, sequela: Secondary | ICD-10-CM

## 2015-06-19 DIAGNOSIS — K648 Other hemorrhoids: Secondary | ICD-10-CM | POA: Diagnosis not present

## 2015-06-19 DIAGNOSIS — F413 Other mixed anxiety disorders: Secondary | ICD-10-CM

## 2015-06-19 DIAGNOSIS — F4310 Post-traumatic stress disorder, unspecified: Secondary | ICD-10-CM

## 2015-06-19 DIAGNOSIS — A630 Anogenital (venereal) warts: Secondary | ICD-10-CM | POA: Diagnosis not present

## 2015-06-19 HISTORY — DX: Anxiety disorder, unspecified: F41.9

## 2015-06-19 HISTORY — DX: Human immunodeficiency virus (HIV) disease: B20

## 2015-06-19 HISTORY — DX: Post-traumatic stress disorder, unspecified: F43.10

## 2015-06-19 MED ORDER — ELVITEG-COBIC-EMTRICIT-TENOFAF 150-150-200-10 MG PO TABS: 1.0000 | ORAL_TABLET | Freq: Every day | ORAL | Status: DC
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
## 2015-07-20 ENCOUNTER — Other Ambulatory Visit: Payer: Self-pay | Admitting: *Deleted

## 2015-07-20 DIAGNOSIS — B20 Human immunodeficiency virus [HIV] disease: Secondary | ICD-10-CM

## 2015-07-20 MED ORDER — ELVITEG-COBIC-EMTRICIT-TENOFAF 150-150-200-10 MG PO TABS: 1.0000 | ORAL_TABLET | Freq: Every day | ORAL | Status: DC

## 2015-07-20 MED ORDER — VALACYCLOVIR HCL 500 MG PO TABS: 500.0000 mg | ORAL_TABLET | Freq: Every day | ORAL | Status: DC

## 2015-09-17 ENCOUNTER — Ambulatory Visit (INDEPENDENT_AMBULATORY_CARE_PROVIDER_SITE_OTHER): Payer: 59 | Admitting: Urgent Care

## 2015-09-17 VITALS — BP 102/76 | HR 74 | Temp 98.0°F | Resp 18 | Ht 71.0 in | Wt 157.6 lb

## 2015-09-17 DIAGNOSIS — R059 Cough, unspecified: Secondary | ICD-10-CM

## 2015-09-17 DIAGNOSIS — R05 Cough: Secondary | ICD-10-CM | POA: Diagnosis not present

## 2015-09-17 DIAGNOSIS — R0981 Nasal congestion: Secondary | ICD-10-CM | POA: Diagnosis not present

## 2015-09-17 DIAGNOSIS — J3489 Other specified disorders of nose and nasal sinuses: Secondary | ICD-10-CM

## 2015-09-17 DIAGNOSIS — B20 Human immunodeficiency virus [HIV] disease: Secondary | ICD-10-CM

## 2015-09-17 DIAGNOSIS — R5383 Other fatigue: Secondary | ICD-10-CM | POA: Diagnosis not present

## 2015-09-17 MED ORDER — HYDROCODONE-HOMATROPINE 5-1.5 MG/5ML PO SYRP: 5.0000 mL | ORAL_SOLUTION | Freq: Every evening | ORAL | Status: DC | PRN

## 2015-09-17 MED ORDER — DOXYCYCLINE HYCLATE 100 MG PO CAPS: 100.0000 mg | ORAL_CAPSULE | Freq: Two times a day (BID) | ORAL | Status: DC

## 2015-09-17 MED ORDER — BENZONATATE 100 MG PO CAPS: 100.0000 mg | ORAL_CAPSULE | Freq: Three times a day (TID) | ORAL | Status: DC | PRN

## 2015-09-17 NOTE — Progress Notes (Signed)
    MRN: [id number] DOB: [birth year]  Subjective:   [facility] is a 45 y.o. male presenting for chief complaint of Cough; Sore Throat; Nasal Congestion; Headache; Nausea; Fatigue; and Generalized Body Aches  Reports 1 week history of worsening productive cough. Cough elicits mild chest pain, interrupts his sleep, has caused him fatigue. Has also had sore throat, sinus pain, sinus headache, now having body aches. Has tried Afrin, Mucinex, cold/flu otc with minimal relief. Denies fever, hemoptysis, chest pain, n/v, abdominal pain. Denies smoking cigarettes. Of note, patient has HIV, takes [place] for this, is managed by Dr. Paulette Blanchornelius [facility]. CD4 count in 05/2015 was at 650.   [facility] has a current medication list which includes the following prescription(s): dextromethorphan-guaifenesin, elvitegravir-cobicistat-emtricitabine-tenofovir, hydrocortisone, multivitamin-iron-minerals-folic acid, ranitidine, triamcinolone ointment, and valacyclovir. Also has No Known Allergies.  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 Also  has no past surgical history on file.  Objective:   Vitals: BP 102/76 mmHg  Pulse 74  Temp(Src) 98 F (36.7 C) (Oral)  Resp 18  Ht 5\' 11"  (1.803 m)  Wt 157 lb 9.6 oz (71.487 kg)  BMI 21.99 kg/m2  SpO2 96%  Physical Exam  Constitutional: He is oriented to person, place, and time. He appears well-developed and well-nourished.  HENT:  TM's intact bilaterally, no effusions or erythema. Nasal turbinates erythematous with thick yellow mucus. No sinus tenderness. Postnasal drip present, without oropharyngeal exudates, erythema or abscesses.  Eyes: Right eye exhibits no discharge. Left eye exhibits no discharge. No scleral  icterus.  Neck: Normal range of motion. Neck supple.  Cardiovascular: Normal rate, regular rhythm and intact distal pulses.  Exam reveals no gallop and no friction rub.   No murmur heard. Pulmonary/Chest: No respiratory distress. He has no wheezes. He has no rales.  Rhonchorous lung sounds throughout.  Lymphadenopathy:    He has cervical adenopathy (bilateral, anterior).  Neurological: He is alert and oriented to person, place, and time.  Skin: Skin is warm and dry.   Assessment and Plan :   1. Cough 2. Sinus pain 3. Nasal congestion 4. Other fatigue - Will cover for infectious process given physical exam findings and progression of his symptoms. Start Doxycycline. Hycodan and Tessalon for cough suppression.  5. HIV disease (HCC) - Stable, continue treatment.  Ricky BambergMario Valgene Deloatch, PA-C [facility] [phone] 09/17/2015 10:44 AM

## 2015-09-17 NOTE — Patient Instructions (Addendum)
Cough, Adult Coughing is a reflex that clears your throat and your airways. Coughing helps to heal and protect your lungs. It is normal to cough occasionally, but a cough that happens with other symptoms or lasts a long time may be a sign of a condition that needs treatment. A cough may last only 2-3 weeks (acute), or it may last longer than 8 weeks (chronic). CAUSES Coughing is commonly caused by:  Breathing in substances that irritate your lungs.  A viral or bacterial respiratory infection.  Allergies.  Asthma.  Postnasal drip.  Smoking.  Acid backing up from the stomach into the esophagus (gastroesophageal reflux).  Certain medicines.  Chronic lung problems, including COPD (or rarely, lung cancer).  Other medical conditions such as heart failure. HOME CARE INSTRUCTIONS  Pay attention to any changes in your symptoms. Take these actions to help with your discomfort:  Take medicines only as told by your health care provider.  If you were prescribed an antibiotic medicine, take it as told by your health care provider. Do not stop taking the antibiotic even if you start to feel better.  Talk with your health care provider before you take a cough suppressant medicine.  Drink enough fluid to keep your urine clear or pale yellow.  If the air is dry, use a cold steam vaporizer or humidifier in your bedroom or your home to help loosen secretions.  Avoid anything that causes you to cough at work or at home.  If your cough is worse at night, try sleeping in a semi-upright position.  Avoid cigarette smoke. If you smoke, quit smoking. If you need help quitting, ask your health care provider.  Avoid caffeine.  Avoid alcohol.  Rest as needed. SEEK MEDICAL CARE IF:   You have new symptoms.  You cough up pus.  Your cough does not get better after 2-3 weeks, or your cough gets worse.  You cannot control your cough with suppressant medicines and you are losing sleep.  You  develop pain that is getting worse or pain that is not controlled with pain medicines.  You have a fever.  You have unexplained weight loss.  You have night sweats. SEEK IMMEDIATE MEDICAL CARE IF:  You cough up blood.  You have difficulty breathing.  Your heartbeat is very fast.   This information is not intended to replace advice given to you by your health care provider. Make sure you discuss any questions you have with your health care provider.   Document Released: 11/02/2010 Document Revised: 01/25/2015 Document Reviewed: 07/13/2014 Elsevier Interactive Patient Education 2016 Elsevier Inc.     IF you received an x-ray today, you will receive an invoice from [facility]. Please contact [facility] at [phone] with questions or concerns regarding your invoice.   IF you received labwork today, you will receive an invoice from [facility]. Please contact [facility] at [phone] with questions or concerns regarding your invoice.   Our billing staff will not be able to assist you with questions regarding bills from these companies.  You will be contacted with the lab results as soon as they are available. The fastest way to get your results is to activate your My Chart account. Instructions are located on the last page of this paperwork. If you have not heard from us regarding the results in 2 weeks, please contact this office.      

## 2016-04-22 ENCOUNTER — Other Ambulatory Visit: Payer: Self-pay | Admitting: Infectious Disease

## 2016-07-12 ENCOUNTER — Other Ambulatory Visit: Payer: Self-pay | Admitting: *Deleted

## 2016-07-12 DIAGNOSIS — Z21 Asymptomatic human immunodeficiency virus [HIV] infection status: Secondary | ICD-10-CM

## 2016-07-12 DIAGNOSIS — B20 Human immunodeficiency virus [HIV] disease: Secondary | ICD-10-CM

## 2016-07-12 MED ORDER — ELVITEG-COBIC-EMTRICIT-TENOFAF 150-150-200-10 MG PO TABS: 1.0000 | ORAL_TABLET | Freq: Every day | ORAL | 6 refills | Status: DC

## 2016-07-18 ENCOUNTER — Ambulatory Visit (INDEPENDENT_AMBULATORY_CARE_PROVIDER_SITE_OTHER): Payer: 59

## 2016-07-18 ENCOUNTER — Ambulatory Visit (INDEPENDENT_AMBULATORY_CARE_PROVIDER_SITE_OTHER): Payer: 59 | Admitting: Physician Assistant

## 2016-07-18 VITALS — BP 120/78 | HR 75 | Temp 98.4°F | Resp 18 | Ht 71.0 in | Wt 157.0 lb

## 2016-07-18 DIAGNOSIS — J209 Acute bronchitis, unspecified: Secondary | ICD-10-CM

## 2016-07-18 DIAGNOSIS — R059 Cough, unspecified: Secondary | ICD-10-CM

## 2016-07-18 DIAGNOSIS — B20 Human immunodeficiency virus [HIV] disease: Secondary | ICD-10-CM | POA: Diagnosis not present

## 2016-07-18 DIAGNOSIS — R0981 Nasal congestion: Secondary | ICD-10-CM

## 2016-07-18 DIAGNOSIS — R05 Cough: Secondary | ICD-10-CM

## 2016-07-18 DIAGNOSIS — Z21 Asymptomatic human immunodeficiency virus [HIV] infection status: Secondary | ICD-10-CM

## 2016-07-18 LAB — POCT CBC
Granulocyte percent: 72.1 %G (ref 37–80)
HCT, POC: 43.2 % — AB (ref 43.5–53.7)
Hemoglobin: 15.4 g/dL (ref 14.1–18.1)
Lymph, poc: 1.7 (ref 0.6–3.4)
MCH, POC: 34.6 pg — AB (ref 27–31.2)
MCHC: 35.7 g/dL — AB (ref 31.8–35.4)
MCV: 97.1 fL — AB (ref 80–97)
MID (cbc): 0.4 (ref 0–0.9)
MPV: 8.4 fL (ref 0–99.8)
POC Granulocyte: 5.4 (ref 2–6.9)
POC LYMPH PERCENT: 22.4 %L (ref 10–50)
POC MID %: 5.5 %M (ref 0–12)
Platelet Count, POC: 203 10*3/uL (ref 142–424)
RBC: 4.44 M/uL — AB (ref 4.69–6.13)
RDW, POC: 13.2 %
WBC: 7.5 10*3/uL (ref 4.6–10.2)

## 2016-07-18 MED ORDER — HYDROCODONE-HOMATROPINE 5-1.5 MG/5ML PO SYRP: 5.0000 mL | ORAL_SOLUTION | Freq: Three times a day (TID) | ORAL | 0 refills | Status: DC | PRN

## 2016-07-18 MED ORDER — AZITHROMYCIN 250 MG PO TABS: ORAL_TABLET | ORAL | 0 refills | Status: DC

## 2016-07-18 MED ORDER — FLUTICASONE PROPIONATE 50 MCG/ACT NA SUSP: 2.0000 | Freq: Every day | NASAL | 6 refills | Status: DC

## 2016-07-18 NOTE — Patient Instructions (Addendum)
Please stay well hydrated, drink plenty of water and hot tea with honey.  Use a neti pot to help with mucus drainage. Flonase: 2 sprays each nostril at night before bed.  Start your antibiotic.  Come back to the clinic if you are not better in one week.   Thank you for coming in today. I hope you feel we met your needs.  Feel free to call [facility] if you have any questions or further requests.  Please consider signing up for MyChart if you do not already have it, as this is a great way to communicate with me.  Best,  Whitney McVey, PA-C  IF you received an x-ray today, you will receive an invoice from [facility]. Please contact [facility] at [phone] with questions or concerns regarding your invoice.   IF you received labwork today, you will receive an invoice from [facility]. Please contact [facility] at [phone] with questions or concerns regarding your invoice.   Our billing staff will not be able to assist you with questions regarding bills from these companies.  You will be contacted with the lab results as soon as they are available. The fastest way to get your results is to activate your My Chart account. Instructions are located on the last page of this paperwork. If you have not heard from [place] regarding the results in 2 weeks, please contact this office.
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
## 2016-11-25 ENCOUNTER — Other Ambulatory Visit: Payer: Self-pay | Admitting: Infectious Disease

## 2016-12-23 ENCOUNTER — Encounter: Payer: Self-pay | Admitting: Urgent Care

## 2016-12-23 ENCOUNTER — Ambulatory Visit (INDEPENDENT_AMBULATORY_CARE_PROVIDER_SITE_OTHER): Payer: 59 | Admitting: Urgent Care

## 2016-12-23 VITALS — BP 129/88 | HR 68 | Temp 97.9°F | Resp 18 | Ht 71.0 in | Wt 162.6 lb

## 2016-12-23 DIAGNOSIS — H9202 Otalgia, left ear: Secondary | ICD-10-CM | POA: Diagnosis not present

## 2016-12-23 DIAGNOSIS — H60392 Other infective otitis externa, left ear: Secondary | ICD-10-CM

## 2016-12-23 MED ORDER — CIPROFLOXACIN-HYDROCORTISONE 0.2-1 % OT SUSP: 3.0000 [drp] | Freq: Two times a day (BID) | OTIC | 0 refills | Status: DC

## 2016-12-23 NOTE — Progress Notes (Signed)
  MRN: [id number] DOB: [birth year]  Subjective:   [facility] is a 46 y.o. male presenting for chief complaint of Ear Pain (x3 days; left ear pain; states it is very bad and itches)  Reports 3 day history of left ear pain. Has had some clear drainage of his left ear, decreased hearing. Had itching of his right ear as well which resolved with otc ear drops but has not worked for his left ear. He did go swimming ~1 week ago at a swimming pool. Denies fever, tinnitus, sinus congestion, sinus pain, cough, sore throat. Smokes a cigarette occasionally.  [facility] has a current medication list which includes the following prescription(s): elvitegravir-cobicistat-emtricitabine-tenofovir, fluticasone, hydrocodone-acetaminophen, and valacyclovir. Also has No Known Allergies.  Ricky BearsLeonardo  has a past medical history of Anal pruritus (02/08/2015); Anxiety disorder (06/19/2015); HIV (human immunodeficiency virus infection) (HCC); HIV disease (HCC) (06/19/2015); HIV infection (HCC); Latent tuberculosis (10/13/2014); and PTSD (post-traumatic stress disorder) (06/19/2015). Has had wisdom tooth extraction.  Objective:   Vitals: BP 129/88   Pulse 68   Temp 97.9 F (36.6 C) (Oral)   Resp 18   Ht 5\' 11"  (1.803 m)   Wt 162 lb 9.6 oz (73.8 kg)   SpO2 96%   BMI 22.68 kg/m   Physical Exam  Constitutional: He is oriented to person, place, and time. He appears well-developed and well-nourished.  HENT:  Left tragus tenderness, external ear canal swelling and thick white clumpy drainage. Left TM intact and without erythema. Right TM normal, no tragus tenderness.   Neck: Normal range of motion. Neck supple.  Cardiovascular: Normal rate.   Pulmonary/Chest: Effort normal.  Lymphadenopathy:    He has no cervical adenopathy.  Neurological: He is alert and oriented to person, place, and time.  Skin: Skin is warm and dry.   Assessment and Plan :   1. Infective otitis externa of left ear 2. Left ear pain -  Start cipro HC to cover for otitis externa, rtc in 1 week if no improvement or sooner if worsening.  Wallis BambergMario Amarilys Lyles, PA-C [facility] [phone] 12/23/2016  8:33 AM

## 2016-12-23 NOTE — Patient Instructions (Addendum)
Otitis externa (Otitis Externa) La otitis externa es una infeccin del canal auditivo externo. El canal auditivo externo se ubica entre la parte externa del odo y la membrana del tmpano. A veces a la otitis externa se la conoce como "odo del nadador". CAUSAS Esta afeccin puede ser causada por lo siguiente:  Nadar en agua sucia.  Humedad en el odo.  Una lesin adentro del odo.  Un objeto atascado en el odo.  Un corte o un rasguo en la parte externa del odo. FACTORES DE RIESGO Es ms probable que esta afeccin se manifieste en los nadadores. SNTOMAS El primer sntoma de esta afeccin suele ser la picazn en el odo. Los signos y sntomas que aparecen ms tarde incluyen los siguientes:  Hinchazn del odo externo.  Enrojecimiento del odo externo.  Dolor de odo. El dolor puede empeorar al tironear la Misenheimeroreja.  Secrecin de pus del odo. DIAGNSTICO Esta afeccin se puede diagnosticar al examinar el odo y Radio broadcast assistantanalizar la secrecin para detectar la presencia de bacterias y hongos. TRATAMIENTO El tratamiento de esta afeccin puede incluir lo siguiente:  Gotas ticas con antibitico. Suelen usarse durante 10 a 14das.  Medicamentos para Associate Professoraliviar la picazn y reducir la hinchazn. INSTRUCCIONES PARA EL CUIDADO EN EL HOGAR  Si le recetaron gotas ticas con antibitico, aplqueselas como se lo haya indicado el mdico. No deje de usar el antibitico aunque la afeccin mejore.  Tome los medicamentos de venta libre y los recetados solamente como se lo haya indicado el mdico.  OceanographerConcurra a todas las visitas de control como se lo haya indicado el mdico. Esto es importante. PREVENCIN  Mantenga el odo seco. Use la punta de una toalla para secarse el odo despus de nadar o darse un bao.  Evite rascarse o introducirse objetos en el odo. Estas cosas pueden daar el canal auditivo externo o quitar el recubrimiento protector de cera, y as Child psychotherapistfacilitar el crecimiento de las bacterias  y de los hongos.  No nade en lagos, aguas contaminadas o piscinas que pueden no contener la cantidad correcta de cloro.  Considere la posibilidad de preparar gotas ticas y ponerse 3 o 4gotas en cada odo despus de nadar. Pregntele al mdico cmo puede preparar las gotas ticas. SOLICITE ATENCIN MDICA SI:  Lance Mussiene fiebre.  Despus de 3das, el odo externo sigue estando rojo, hinchado, le duele o tiene supuracin de pus.  El dolor, la hinchazn o el enrojecimiento empeoran.  Tiene un dolor de cabeza intenso.  Tiene en la zona detrs de la oreja roja, hinchada, le duele o est sensible. Esta informacin no tiene Theme park managercomo fin reemplazar el consejo del mdico. Asegrese de hacerle al mdico cualquier pregunta que tenga. Document Released: 05/06/2005 Document Revised: 05/27/2014 Document Reviewed: 02/13/2015 Elsevier Interactive Patient Education  2018 [employer]     IF you received an x-ray today, you will receive an invoice from [facility]. Please contact [facility] at [phone] with questions or concerns regarding your invoice.   IF you received labwork today, you will receive an invoice from [facility]. Please contact [facility] at [phone] with questions or concerns regarding your invoice.   Our billing staff will not be able to assist you with questions regarding bills from these companies.  You will be contacted with the lab results as soon as they are available. The fastest way to get your results is to activate your My Chart account. Instructions are located on the last page of this paperwork. If you have not heard from us regarding  the results in 2 weeks, please contact this office.

## 2017-01-09 ENCOUNTER — Telehealth: Payer: Self-pay | Admitting: *Deleted

## 2017-01-09 ENCOUNTER — Other Ambulatory Visit: Payer: Self-pay | Admitting: Infectious Disease

## 2017-01-09 NOTE — Telephone Encounter (Signed)
Patient requesting refill of valtrex. He has not been seen since January 2017. RN called patient. He states he has been working, forgot how long it had been. He denies missed medication. He is scheduled for labs Monday, will follow up with Dr Daiva Eves 9/17. 30 days valtrex filled. Patient aware he needs to keep his appointment for future refills Andree Coss, RN

## 2017-01-13 ENCOUNTER — Other Ambulatory Visit (HOSPITAL_COMMUNITY)
Admission: RE | Admit: 2017-01-13 | Discharge: 2017-01-13 | Disposition: A | Discharge status: Home / Self Care | Payer: 59 | Source: Ambulatory Visit | Attending: Infectious Disease | Admitting: Infectious Disease

## 2017-01-13 ENCOUNTER — Other Ambulatory Visit: Payer: 59

## 2017-01-13 DIAGNOSIS — B20 Human immunodeficiency virus [HIV] disease: Secondary | ICD-10-CM | POA: Diagnosis present

## 2017-01-13 LAB — COMPLETE METABOLIC PANEL WITH GFR
ALT: 21 U/L (ref 9–46)
AST: 21 U/L (ref 10–40)
Albumin: 4.3 g/dL (ref 3.6–5.1)
Alkaline Phosphatase: 82 U/L (ref 40–115)
BUN: 17 mg/dL (ref 7–25)
CO2: 24 mmol/L (ref 20–32)
Calcium: 9.1 mg/dL (ref 8.6–10.3)
Chloride: 103 mmol/L (ref 98–110)
Creat: 1.08 mg/dL (ref 0.60–1.35)
GFR, Est African American: >89 mL/min (ref >=60)
GFR, Est Non African American: 82 mL/min (ref >=60)
Glucose, Bld: 177 mg/dL — ABNORMAL HIGH (ref 65–99)
Potassium: 4.2 mmol/L (ref 3.5–5.3)
Sodium: 137 mmol/L (ref 135–146)
Total Bilirubin: 0.5 mg/dL (ref 0.2–1.2)
Total Protein: 7 g/dL (ref 6.1–8.1)

## 2017-01-13 LAB — CBC WITH DIFFERENTIAL/PLATELET
Basophils Absolute: 0 cells/uL (ref 0–200)
Basophils Relative: 0 %
Eosinophils Absolute: 186 cells/uL (ref 15–500)
Eosinophils Relative: 2 %
HCT: 46.1 % (ref 38.5–50.0)
Hemoglobin: 16 g/dL (ref 13.2–17.1)
Lymphocytes Relative: 14 %
Lymphs Abs: 1302 cells/uL (ref 850–3900)
MCH: 33.5 pg — ABNORMAL HIGH (ref 27.0–33.0)
MCHC: 34.7 g/dL (ref 32.0–36.0)
MCV: 96.4 fL (ref 80.0–100.0)
MPV: 11.7 fL (ref 7.5–12.5)
Monocytes Absolute: 558 cells/uL (ref 200–950)
Monocytes Relative: 6 %
Neutro Abs: 7254 cells/uL (ref 1500–7800)
Neutrophils Relative %: 78 %
Platelets: 189 10*3/uL (ref 140–400)
RBC: 4.78 MIL/uL (ref 4.20–5.80)
RDW: 13.7 % (ref 11.0–15.0)
WBC: 9.3 10*3/uL (ref 3.8–10.8)

## 2017-01-14 LAB — T-HELPER CELL (CD4) - (RCID CLINIC ONLY)
CD4 % Helper T Cell: 37 % (ref 33–55)
CD4 T Cell Abs: 600 /uL (ref 400–2700)

## 2017-01-14 LAB — URINE CYTOLOGY ANCILLARY ONLY
Chlamydia: NEGATIVE
Neisseria Gonorrhea: NEGATIVE

## 2017-01-14 LAB — RPR

## 2017-01-16 LAB — HIV-1 RNA QUANT-NO REFLEX-BLD
HIV 1 RNA Quant: <20 copies/mL — AB
HIV-1 RNA Quant, Log: <1.3 Log copies/mL — AB

## 2017-02-03 ENCOUNTER — Ambulatory Visit: Payer: Self-pay | Admitting: Infectious Disease

## 2017-02-04 ENCOUNTER — Other Ambulatory Visit: Payer: Self-pay | Admitting: Infectious Disease

## 2017-02-05 ENCOUNTER — Encounter: Payer: Self-pay | Admitting: Infectious Disease

## 2017-02-05 ENCOUNTER — Ambulatory Visit (INDEPENDENT_AMBULATORY_CARE_PROVIDER_SITE_OTHER): Payer: 59 | Admitting: Infectious Disease

## 2017-02-05 ENCOUNTER — Encounter (INDEPENDENT_AMBULATORY_CARE_PROVIDER_SITE_OTHER): Payer: Self-pay

## 2017-02-05 VITALS — BP 122/84 | HR 68 | Temp 98.3°F | Ht 69.0 in | Wt 161.0 lb

## 2017-02-05 DIAGNOSIS — Z79899 Other long term (current) drug therapy: Secondary | ICD-10-CM

## 2017-02-05 DIAGNOSIS — F413 Other mixed anxiety disorders: Secondary | ICD-10-CM

## 2017-02-05 DIAGNOSIS — Z23 Encounter for immunization: Secondary | ICD-10-CM | POA: Diagnosis not present

## 2017-02-05 DIAGNOSIS — B20 Human immunodeficiency virus [HIV] disease: Secondary | ICD-10-CM

## 2017-02-05 DIAGNOSIS — Z113 Encounter for screening for infections with a predominantly sexual mode of transmission: Secondary | ICD-10-CM | POA: Diagnosis not present
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
## 2017-02-17 ENCOUNTER — Other Ambulatory Visit: Payer: Self-pay | Admitting: Infectious Disease

## 2017-02-17 DIAGNOSIS — B20 Human immunodeficiency virus [HIV] disease: Secondary | ICD-10-CM

## 2017-02-17 DIAGNOSIS — Z21 Asymptomatic human immunodeficiency virus [HIV] infection status: Secondary | ICD-10-CM

## 2017-02-25 ENCOUNTER — Other Ambulatory Visit: Payer: Self-pay | Admitting: Infectious Disease

## 2017-04-21 ENCOUNTER — Other Ambulatory Visit: Payer: Self-pay | Admitting: Infectious Disease

## 2017-04-21 DIAGNOSIS — A630 Anogenital (venereal) warts: Secondary | ICD-10-CM

## 2017-07-23 ENCOUNTER — Other Ambulatory Visit (HOSPITAL_COMMUNITY)
Admission: RE | Admit: 2017-07-23 | Discharge: 2017-07-23 | Disposition: A | Discharge status: Home / Self Care | Payer: Managed Care, Other (non HMO) | Source: Ambulatory Visit | Attending: Infectious Disease | Admitting: Infectious Disease

## 2017-07-23 ENCOUNTER — Other Ambulatory Visit: Payer: Managed Care, Other (non HMO)

## 2017-07-23 DIAGNOSIS — Z79899 Other long term (current) drug therapy: Secondary | ICD-10-CM | POA: Insufficient documentation

## 2017-07-23 DIAGNOSIS — Z113 Encounter for screening for infections with a predominantly sexual mode of transmission: Secondary | ICD-10-CM | POA: Insufficient documentation

## 2017-07-23 DIAGNOSIS — B20 Human immunodeficiency virus [HIV] disease: Secondary | ICD-10-CM | POA: Insufficient documentation

## 2017-07-23 DIAGNOSIS — F413 Other mixed anxiety disorders: Secondary | ICD-10-CM

## 2017-07-23 LAB — MICROALBUMIN / CREATININE URINE RATIO
Creatinine, Urine: 208 mg/dL (ref 20–320)
Microalb Creat Ratio: 3 mcg/mg creat (ref <30)
Microalb, Ur: 0.7 mg/dL

## 2017-07-24 LAB — COMPLETE METABOLIC PANEL WITH GFR
AG Ratio: 1.8 (calc) (ref 1.0–2.5)
ALT: 22 U/L (ref 9–46)
AST: 18 U/L (ref 10–40)
Albumin: 4.2 g/dL (ref 3.6–5.1)
Alkaline phosphatase (APISO): 74 U/L (ref 40–115)
BUN: 19 mg/dL (ref 7–25)
CO2: 27 mmol/L (ref 20–32)
Calcium: 9.2 mg/dL (ref 8.6–10.3)
Chloride: 106 mmol/L (ref 98–110)
Creat: 0.99 mg/dL (ref 0.60–1.35)
GFR, Est African American: 105 mL/min/{1.73_m2} (ref >=60)
GFR, Est Non African American: 91 mL/min/{1.73_m2} (ref >=60)
Globulin: 2.4 g/dL (calc) (ref 1.9–3.7)
Glucose, Bld: 144 mg/dL — ABNORMAL HIGH (ref 65–99)
Potassium: 4.2 mmol/L (ref 3.5–5.3)
Sodium: 140 mmol/L (ref 135–146)
Total Bilirubin: 0.5 mg/dL (ref 0.2–1.2)
Total Protein: 6.6 g/dL (ref 6.1–8.1)

## 2017-07-24 LAB — CBC WITH DIFFERENTIAL/PLATELET
Basophils Absolute: 53 cells/uL (ref 0–200)
Basophils Relative: 1 %
Eosinophils Absolute: 122 cells/uL (ref 15–500)
Eosinophils Relative: 2.3 %
HCT: 42.7 % (ref 38.5–50.0)
Hemoglobin: 15.4 g/dL (ref 13.2–17.1)
Lymphs Abs: 1585 cells/uL (ref 850–3900)
MCH: 34.1 pg — ABNORMAL HIGH (ref 27.0–33.0)
MCHC: 36.1 g/dL — ABNORMAL HIGH (ref 32.0–36.0)
MCV: 94.7 fL (ref 80.0–100.0)
MPV: 11.7 fL (ref 7.5–12.5)
Monocytes Relative: 9.1 %
Neutro Abs: 3058 cells/uL (ref 1500–7800)
Neutrophils Relative %: 57.7 %
Platelets: 181 10*3/uL (ref 140–400)
RBC: 4.51 10*6/uL (ref 4.20–5.80)
RDW: 12.9 % (ref 11.0–15.0)
Total Lymphocyte: 29.9 %
WBC mixed population: 482 cells/uL (ref 200–950)
WBC: 5.3 10*3/uL (ref 3.8–10.8)

## 2017-07-24 LAB — T-HELPER CELL (CD4) - (RCID CLINIC ONLY)
CD4 % Helper T Cell: 38 % (ref 33–55)
CD4 T Cell Abs: 650 /uL (ref 400–2700)

## 2017-07-24 LAB — RPR: RPR Ser Ql: NONREACTIVE

## 2017-07-24 LAB — URINE CYTOLOGY ANCILLARY ONLY
Chlamydia: NEGATIVE
Neisseria Gonorrhea: NEGATIVE

## 2017-07-24 LAB — LIPID PANEL
Cholesterol: 279 mg/dL — ABNORMAL HIGH (ref <200)
HDL: 39 mg/dL — ABNORMAL LOW (ref >40)
Non-HDL Cholesterol (Calc): 240 mg/dL (calc) — ABNORMAL HIGH (ref <130)
Total CHOL/HDL Ratio: 7.2 (calc) — ABNORMAL HIGH (ref <5.0)
Triglycerides: 653 mg/dL — ABNORMAL HIGH (ref <150)

## 2017-07-25 LAB — HIV-1 RNA QUANT-NO REFLEX-BLD
HIV 1 RNA Quant: <20 copies/mL
HIV-1 RNA Quant, Log: <1.3 Log copies/mL

## 2017-08-06 ENCOUNTER — Ambulatory Visit: Payer: Self-pay | Admitting: Infectious Disease

## 2017-08-07 ENCOUNTER — Ambulatory Visit: Payer: Self-pay | Admitting: Infectious Disease

## 2017-08-11 ENCOUNTER — Ambulatory Visit: Payer: Managed Care, Other (non HMO) | Admitting: Infectious Disease

## 2017-08-11 VITALS — BP 122/82 | HR 69 | Temp 97.8°F | Ht 69.0 in | Wt 166.0 lb

## 2017-08-11 DIAGNOSIS — B20 Human immunodeficiency virus [HIV] disease: Secondary | ICD-10-CM | POA: Diagnosis not present

## 2017-08-11 DIAGNOSIS — Z23 Encounter for immunization: Secondary | ICD-10-CM | POA: Diagnosis not present

## 2017-08-11 DIAGNOSIS — A630 Anogenital (venereal) warts: Secondary | ICD-10-CM

## 2017-08-11 DIAGNOSIS — F4310 Post-traumatic stress disorder, unspecified: Secondary | ICD-10-CM

## 2017-08-11 DIAGNOSIS — A084 Viral intestinal infection, unspecified: Secondary | ICD-10-CM | POA: Diagnosis not present

## 2017-08-11 MED ORDER — ELVITEG-COBIC-EMTRICIT-TENOFAF 150-150-200-10 MG PO TABS: 1.0000 | ORAL_TABLET | Freq: Every day | ORAL | 11 refills | Status: DC

## 2017-08-11 MED ORDER — VALACYCLOVIR HCL 500 MG PO TABS: 500.0000 mg | ORAL_TABLET | Freq: Every day | ORAL | 5 refills | Status: DC
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
## 2017-09-02 ENCOUNTER — Other Ambulatory Visit: Payer: Self-pay | Admitting: Infectious Disease

## 2017-09-02 DIAGNOSIS — B20 Human immunodeficiency virus [HIV] disease: Secondary | ICD-10-CM

## 2017-10-08 ENCOUNTER — Other Ambulatory Visit: Payer: Self-pay | Admitting: Infectious Disease

## 2017-10-08 DIAGNOSIS — B20 Human immunodeficiency virus [HIV] disease: Secondary | ICD-10-CM

## 2018-02-17 ENCOUNTER — Encounter (HOSPITAL_COMMUNITY): Payer: Self-pay

## 2018-02-17 ENCOUNTER — Ambulatory Visit (HOSPITAL_COMMUNITY)
Admission: EM | Admit: 2018-02-17 | Discharge: 2018-02-17 | Disposition: A | Discharge status: Home / Self Care | Payer: Managed Care, Other (non HMO) | Attending: Family Medicine | Admitting: Family Medicine

## 2018-02-17 ENCOUNTER — Other Ambulatory Visit: Payer: Self-pay

## 2018-02-17 DIAGNOSIS — L03011 Cellulitis of right finger: Secondary | ICD-10-CM

## 2018-02-17 MED ORDER — CEPHALEXIN 500 MG PO CAPS: 500.0000 mg | ORAL_CAPSULE | Freq: Four times a day (QID) | ORAL | 0 refills | Status: AC

## 2018-02-17 MED ORDER — MUPIROCIN 2 % EX OINT: 1.0000 "application " | TOPICAL_OINTMENT | Freq: Two times a day (BID) | CUTANEOUS | 0 refills | Status: DC
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
## 2018-02-17 NOTE — ED Triage Notes (Signed)
Pt states he has a bad nail on his right hand thumb nail. X 1 week or more.

## 2018-02-17 NOTE — Discharge Instructions (Signed)
Please begin taking keflex every 6 hours for 5 days Apply bactroban twice daily to nail Perform hot soaks 2-3 times a day to help with healing and expressing further drainage  Follow up if developing fever, difficulty moving finger, pain wrapping around finger, not improving or spreading

## 2018-02-17 NOTE — ED Notes (Signed)
No answer

## 2018-03-31 ENCOUNTER — Other Ambulatory Visit: Payer: Self-pay | Admitting: Infectious Disease

## 2018-03-31 DIAGNOSIS — B20 Human immunodeficiency virus [HIV] disease: Secondary | ICD-10-CM

## 2018-04-15 ENCOUNTER — Other Ambulatory Visit: Payer: Self-pay | Admitting: Infectious Disease

## 2018-04-15 DIAGNOSIS — B20 Human immunodeficiency virus [HIV] disease: Secondary | ICD-10-CM

## 2018-04-20 ENCOUNTER — Telehealth: Payer: Self-pay | Admitting: Infectious Disease

## 2018-04-20 NOTE — Telephone Encounter (Signed)
Patient needs a refill of genvoya sent to pharmacy.  He is leaving soon to visit in [place] and is out of medication.

## 2018-04-20 NOTE — Telephone Encounter (Signed)
Spoke with patient. Refills were sent to mail order 11/29. He will call pharmacy to coordinate delivery.

## 2018-04-28 ENCOUNTER — Other Ambulatory Visit: Payer: Self-pay | Admitting: Physician Assistant

## 2018-04-28 DIAGNOSIS — R0981 Nasal congestion: Secondary | ICD-10-CM

## 2018-04-28 NOTE — Telephone Encounter (Signed)
Requested medication (s) are due for refill today: Yes  Requested medication (s) are on the active medication list: No  Last refill:  07/18/16  Future visit scheduled: No  Notes to clinic:  Med not on medication list.    Requested Prescriptions  Pending Prescriptions Disp Refills   fluticasone (FLONASE) 50 MCG/ACT nasal spray [Pharmacy Med Name: FLUTICASONE PROP 50 MCG SPRAY]  6    Sig: Place 2 sprays into both nostrils daily.     Ear, Nose, and Throat: Nasal Preparations - Corticosteroids Failed - 04/28/2018 10:38 AM      Failed - Valid encounter within last 12 months    Recent Outpatient Visits          1 year ago Infective otitis externa of left ear   [facility], [place], [place]   1 year ago Acute bronchitis, unspecified organism   [facility], Madelaine BhatElizabeth Whitney, PA-C   2 years ago Cough   [facility], [place], [place]   3 years ago CAP (community acquired pneumonia)   [facility], Maylon PeppersSteven A, MD

## 2018-10-22 ENCOUNTER — Other Ambulatory Visit: Payer: Managed Care, Other (non HMO)

## 2018-10-22 ENCOUNTER — Other Ambulatory Visit: Payer: Self-pay

## 2018-10-22 ENCOUNTER — Other Ambulatory Visit (HOSPITAL_COMMUNITY)
Admission: RE | Admit: 2018-10-22 | Discharge: 2018-10-22 | Disposition: A | Discharge status: Home / Self Care | Payer: Managed Care, Other (non HMO) | Source: Ambulatory Visit | Attending: Infectious Disease | Admitting: Infectious Disease

## 2018-10-22 DIAGNOSIS — A630 Anogenital (venereal) warts: Secondary | ICD-10-CM | POA: Diagnosis present

## 2018-10-22 DIAGNOSIS — B20 Human immunodeficiency virus [HIV] disease: Secondary | ICD-10-CM

## 2018-10-22 DIAGNOSIS — F4310 Post-traumatic stress disorder, unspecified: Secondary | ICD-10-CM | POA: Diagnosis present

## 2018-10-23 LAB — URINE CYTOLOGY ANCILLARY ONLY
Chlamydia: NEGATIVE
Neisseria Gonorrhea: NEGATIVE

## 2018-10-23 LAB — T-HELPER CELL (CD4) - (RCID CLINIC ONLY)
CD4 % Helper T Cell: 36 % (ref 33–65)
CD4 T Cell Abs: 565 /uL (ref 400–1790)

## 2018-11-04 ENCOUNTER — Telehealth: Payer: Self-pay | Admitting: Infectious Disease

## 2018-11-04 NOTE — Telephone Encounter (Signed)
[id number] Pre-Screening Questions: 11/04/2018  Do you currently have a fever (>100 F), chills or unexplained body aches? NO  Are you currently experiencing new cough, shortness of breath, sore throat, runny nose?NO   Have you recently travelled outside the state of [place] in the last 14 days? NO    Have you been in contact with someone that is currently pending confirmation of Covid19 testing or has been confirmed to have the [place] virus?  NO  **If the patient answers NO to ALL questions -  advise the patient to please call the clinic before coming to the office should any symptoms develop.

## 2018-11-05 ENCOUNTER — Encounter: Payer: Self-pay | Admitting: Infectious Disease

## 2018-11-05 ENCOUNTER — Ambulatory Visit: Payer: Managed Care, Other (non HMO) | Admitting: Infectious Disease

## 2018-11-05 ENCOUNTER — Other Ambulatory Visit: Payer: Self-pay

## 2018-11-05 VITALS — BP 112/73 | HR 80 | Temp 98.0°F | Ht 69.0 in | Wt 162.0 lb

## 2018-11-05 DIAGNOSIS — B20 Human immunodeficiency virus [HIV] disease: Secondary | ICD-10-CM

## 2018-11-05 DIAGNOSIS — A630 Anogenital (venereal) warts: Secondary | ICD-10-CM | POA: Diagnosis not present

## 2018-11-05 DIAGNOSIS — Z23 Encounter for immunization: Secondary | ICD-10-CM

## 2018-11-05 LAB — RPR: RPR Ser Ql: NONREACTIVE

## 2018-11-05 LAB — CBC WITH DIFFERENTIAL/PLATELET
Absolute Monocytes: 475 cells/uL (ref 200–950)
Basophils Absolute: 20 cells/uL (ref 0–200)
Basophils Relative: 0.4 %
Eosinophils Absolute: 90 cells/uL (ref 15–500)
Eosinophils Relative: 1.8 %
HCT: 44.8 % (ref 38.5–50.0)
Hemoglobin: 15.7 g/dL (ref 13.2–17.1)
Lymphs Abs: 1585 cells/uL (ref 850–3900)
MCH: 32.8 pg (ref 27.0–33.0)
MCHC: 35 g/dL (ref 32.0–36.0)
MCV: 93.5 fL (ref 80.0–100.0)
MPV: 12.8 fL — ABNORMAL HIGH (ref 7.5–12.5)
Monocytes Relative: 9.5 %
Neutro Abs: 2830 cells/uL (ref 1500–7800)
Neutrophils Relative %: 56.6 %
Platelets: 152 10*3/uL (ref 140–400)
RBC: 4.79 10*6/uL (ref 4.20–5.80)
RDW: 13.8 % (ref 11.0–15.0)
Total Lymphocyte: 31.7 %
WBC: 5 10*3/uL (ref 3.8–10.8)

## 2018-11-05 LAB — COMPLETE METABOLIC PANEL WITH GFR
AG Ratio: 1.5 (calc) (ref 1.0–2.5)
ALT: 17 U/L (ref 9–46)
AST: 14 U/L (ref 10–40)
Albumin: 4 g/dL (ref 3.6–5.1)
Alkaline phosphatase (APISO): 80 U/L (ref 36–130)
BUN: 12 mg/dL (ref 7–25)
CO2: 27 mmol/L (ref 20–32)
Calcium: 9.2 mg/dL (ref 8.6–10.3)
Chloride: 105 mmol/L (ref 98–110)
Creat: 0.96 mg/dL (ref 0.60–1.35)
GFR, Est African American: 108 mL/min/{1.73_m2} (ref >=60)
GFR, Est Non African American: 93 mL/min/{1.73_m2} (ref >=60)
Globulin: 2.6 g/dL (calc) (ref 1.9–3.7)
Glucose, Bld: 141 mg/dL — ABNORMAL HIGH (ref 65–99)
Potassium: 4.3 mmol/L (ref 3.5–5.3)
Sodium: 140 mmol/L (ref 135–146)
Total Bilirubin: 0.4 mg/dL (ref 0.2–1.2)
Total Protein: 6.6 g/dL (ref 6.1–8.1)

## 2018-11-05 LAB — LIPID PANEL
Cholesterol: 213 mg/dL — ABNORMAL HIGH (ref <200)
HDL: 37 mg/dL — ABNORMAL LOW (ref >=40)
Non-HDL Cholesterol (Calc): 176 mg/dL (calc) — ABNORMAL HIGH (ref <130)
Total CHOL/HDL Ratio: 5.8 (calc) — ABNORMAL HIGH (ref <5.0)
Triglycerides: 404 mg/dL — ABNORMAL HIGH (ref <150)

## 2018-11-05 LAB — HIV-1 RNA QUANT-NO REFLEX-BLD
HIV 1 RNA Quant: 51 copies/mL — ABNORMAL HIGH
HIV-1 RNA Quant, Log: 1.71 Log copies/mL — ABNORMAL HIGH

## 2018-11-05 MED ORDER — GENVOYA 150-150-200-10 MG PO TABS: 1.0000 | ORAL_TABLET | Freq: Every day | ORAL | 11 refills | Status: DC
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
## 2018-11-18 ENCOUNTER — Other Ambulatory Visit: Payer: Self-pay | Admitting: Infectious Disease

## 2018-11-18 DIAGNOSIS — B20 Human immunodeficiency virus [HIV] disease: Secondary | ICD-10-CM

## 2018-11-18 DIAGNOSIS — A630 Anogenital (venereal) warts: Secondary | ICD-10-CM

## 2018-12-22 ENCOUNTER — Ambulatory Visit: Payer: Managed Care, Other (non HMO) | Admitting: Family

## 2018-12-22 ENCOUNTER — Other Ambulatory Visit (HOSPITAL_COMMUNITY)
Admission: RE | Admit: 2018-12-22 | Discharge: 2018-12-22 | Disposition: A | Discharge status: Home / Self Care | Payer: Managed Care, Other (non HMO) | Source: Ambulatory Visit | Attending: Family | Admitting: Family

## 2018-12-22 ENCOUNTER — Encounter: Payer: Self-pay | Admitting: Family

## 2018-12-22 ENCOUNTER — Other Ambulatory Visit: Payer: Self-pay

## 2018-12-22 VITALS — BP 143/97 | HR 80 | Temp 98.1°F

## 2018-12-22 DIAGNOSIS — L290 Pruritus ani: Secondary | ICD-10-CM | POA: Insufficient documentation

## 2018-12-22 MED ORDER — TRIAMCINOLONE ACETONIDE 0.025 % EX CREA: 1.0000 "application " | TOPICAL_CREAM | Freq: Two times a day (BID) | CUTANEOUS | 0 refills | Status: AC

## 2018-12-22 NOTE — Patient Instructions (Addendum)
Nice to see you.  We will screen you for gonorrha and Chlamydia which may require additional treatment. We will let you know your results.   Triamcinalone cream has been sent to your pharmacy. Do not use in your rectum.  Please let us know if your symptoms do not improve.   Have a great day and stay safe!

## 2018-12-22 NOTE — Assessment & Plan Note (Signed)
Ricky Holt has anal pruritus of unclear origin although cannot rule out STD.  Rectal gonorrhea/chlamydia swab obtained to rule out infection.  Will provide low-dose steroid cream to aid in itching.  This may also be the result of his warts causing the itching or possible hemorrhoid which was not able to be found on exam today.  Follow-up if symptoms worsen or do not improve.

## 2018-12-22 NOTE — Progress Notes (Signed)
Subjective:    Patient ID: Ricky Holt, male    DOB: 03-07-1971, 49 y.o.   MRN: [id number]  Chief Complaint  Patient presents with  . Anal Itching     HPI:  Ricky Holt is a 48 y.o. male with latent tuberculosis, PTSD, HIV disease, and anxiety presenting today for an acute office visit.  Ricky Holt has been experiencing new onset rectal itching going on for approximately 3 days and refractory to over-the-counter medications including antifungal creams and Preparation H.  Itching is constant and worsening and from feel appears to be spreading.  He has no systemic symptoms.  No recent rectal intercourse and has not been tested for gonorrhea/chlamydia.  During recent office visit noted to have anal warts and scheduled to see general surgery to have them removed   No Known Allergies    Outpatient Medications Prior to Visit  Medication Sig Dispense Refill  . calcium carbonate (TUMS - DOSED IN MG ELEMENTAL CALCIUM) 500 MG chewable tablet Chew 1 tablet by mouth daily as needed for indigestion or heartburn.    . elvitegravir-cobicistat-emtricitabine-tenofovir (GENVOYA) 150-150-200-10 MG TABS tablet Take 1 tablet by mouth daily. 30 tablet 11  . Multiple Vitamin (MULTI-VITAMINS) TABS Take by mouth.    . valACYclovir (VALTREX) 500 MG tablet TAKE 1 TABLET BY MOUTH  DAILY 90 tablet 5   No facility-administered medications prior to visit.      Past Medical History:  Diagnosis Date  . Anal pruritus 02/08/2015  . Anxiety disorder 06/19/2015  . HIV (human immunodeficiency virus infection) ([place])   . HIV disease ([place]) 06/19/2015  . HIV infection ([place])   . 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 PTSD (post-traumatic stress disorder) 06/19/2015     History reviewed. No pertinent surgical history.     Review of Systems  Constitutional: Negative for appetite change, chills, fatigue, fever and unexpected weight change.  Eyes: Negative for visual disturbance.   Respiratory: Negative for cough, chest tightness, shortness of breath and wheezing.   Cardiovascular: Negative for chest pain and leg swelling.  Gastrointestinal: Negative for abdominal pain, constipation, diarrhea, nausea and vomiting.  Genitourinary: Negative for dysuria, flank pain, frequency, genital sores, hematuria and urgency.       Positive for rectal itching  Skin: Negative for rash.  Allergic/Immunologic: Negative for immunocompromised state.  Neurological: Negative for dizziness and headaches.      Objective:    BP (!) 143/97   Pulse 80   Temp 98.1 F (36.7 C)  Nursing note and vital signs reviewed.  Physical Exam Constitutional:      General: He is not in acute distress.    Appearance: He is well-developed.  Cardiovascular:     Rate and Rhythm: Normal rate and regular rhythm.     Heart sounds: Normal heart sounds.  Pulmonary:     Effort: Pulmonary effort is normal.     Breath sounds: Normal breath sounds.  Genitourinary:    Comments: Mildly reddened rash located around the gluteal cleft on bilateral sides greater on the right than on the left.  There is no obvious hemorrhoid present.  Not tender to the touch.  Does appear maculopapular.  No drainage or odor present. Skin:    General: Skin is warm and dry.  Neurological:     Mental Status: He is alert and oriented to person, place, and time.  Psychiatric:        Behavior: Behavior normal.        Thought Content: Thought content normal.  Judgment: Judgment normal.      Depression screen [facility] 2/9 12/22/2018 11/05/2018 02/05/2017 12/23/2016 07/18/2016  Decreased Interest 0 0 0 0 0  Down, Depressed, Hopeless 0 0 0 0 0  PHQ - 2 Score 0 0 0 0 0  Altered sleeping - - - - -  Tired, decreased energy - - - - -  Change in appetite - - - - -  Feeling bad or failure about yourself  - - - - -  Trouble concentrating - - - - -  Moving slowly or fidgety/restless - - - - -  Suicidal thoughts - - - - -  PHQ-9 Score - - - -  -  Difficult doing work/chores - - - - -       Assessment & Plan:   Problem List Items Addressed This Visit      Musculoskeletal and Integument   Anal pruritus - Primary    Ricky Holt has anal pruritus of unclear origin although cannot rule out STD.  Rectal gonorrhea/chlamydia swab obtained to rule out infection.  Will provide low-dose steroid cream to aid in itching.  This may also be the result of his warts causing the itching or possible hemorrhoid which was not able to be found on exam today.  Follow-up if symptoms worsen or do not improve.      Relevant Medications   triamcinolone (KENALOG) 0.025 % cream   Other Relevant Orders   Cytology (oral, anal, urethral) ancillary only       I am having Ricky Holt start on triamcinolone. I am also having him maintain his Multi-Vitamins, calcium carbonate, Genvoya, and valACYclovir.   Meds ordered this encounter  Medications  . triamcinolone (KENALOG) 0.025 % cream    Sig: Apply 1 application topically 2 (two) times daily. Do not use in the rectum.    Dispense:  30 g    Refill:  0    Order Specific Question:   Supervising Provider    Answer:   Judyann MunsonSNIDER, CYNTHIA [[id number]]     Follow-up: Return if symptoms worsen or fail to improve.   Marcos EkeGreg Calone, MSN, FNP-C Nurse Practitioner [facility] Main number: [phone]

## 2018-12-24 ENCOUNTER — Telehealth: Payer: Self-pay

## 2018-12-24 LAB — CYTOLOGY, (ORAL, ANAL, URETHRAL) ANCILLARY ONLY
Chlamydia: NEGATIVE
Neisseria Gonorrhea: NEGATIVE

## 2018-12-24 NOTE — Telephone Encounter (Signed)
-----   Message from [place], [place] sent at 12/24/2018  3:31 PM EDT ----- Please inform Mr. Ricky Holt that he was negative for gonorrhea and chlamydia. Follow up if symptoms worsen or do not improve.

## 2018-12-24 NOTE — Telephone Encounter (Signed)
Contacted [facility] to relay negative test results, per Terri Piedra NP.   Verified identity with 2 identifiers.   Ricky Holt was content with the negative results, he states that if the new medication has not helped provide aid to his rash, he will most definitely contact us for a stronger medication.   Patient didn't have any further questions or concerns.    Lenore Cordia, [place]

## 2019-04-05 ENCOUNTER — Other Ambulatory Visit: Payer: Self-pay

## 2019-04-05 ENCOUNTER — Encounter: Payer: Self-pay | Admitting: Registered Nurse

## 2019-04-05 ENCOUNTER — Ambulatory Visit: Payer: Managed Care, Other (non HMO) | Admitting: Registered Nurse

## 2019-04-05 VITALS — BP 137/85 | HR 73 | Temp 98.0°F | Resp 16 | Wt 159.0 lb

## 2019-04-05 DIAGNOSIS — Z23 Encounter for immunization: Secondary | ICD-10-CM

## 2019-04-05 DIAGNOSIS — H109 Unspecified conjunctivitis: Secondary | ICD-10-CM | POA: Diagnosis not present

## 2019-04-05 MED ORDER — ERYTHROMYCIN 5 MG/GM OP OINT: 1.0000 "application " | TOPICAL_OINTMENT | Freq: Four times a day (QID) | OPHTHALMIC | 0 refills | Status: AC

## 2019-04-05 NOTE — Patient Instructions (Signed)
° ° ° °  If you have lab work done today you will be contacted with your lab results within the next 2 weeks.  If you have not heard from us then please contact us. The fastest way to get your results is to register for My Chart. ° ° °IF you received an x-ray today, you will receive an invoice from [facility]. Please contact [facility] at [phone] with questions or concerns regarding your invoice.  ° °IF you received labwork today, you will receive an invoice from [facility]. Please contact [facility] at [phone] with questions or concerns regarding your invoice.  ° °Our billing staff will not be able to assist you with questions regarding bills from these companies. ° °You will be contacted with the lab results as soon as they are available. The fastest way to get your results is to activate your My Chart account. Instructions are located on the last page of this paperwork. If you have not heard from us regarding the results in 2 weeks, please contact this office. °  ° ° ° °

## 2019-04-05 NOTE — Progress Notes (Signed)
Acute Office Visit  Subjective:    Patient ID: Ricky Holt, male    DOB: 1970/12/03, 48 y.o.   MRN: [id number]  Chief Complaint  Patient presents with  . Conjunctivitis    pt states both eyes have been red,itchy,drainage x 3 days     HPI Patient is in today for conjunctivitis.  Onset 3 days prior to visit. Notes that it has worsened significantly. He states it is not painful and does not affect his vision, but his eyes are bloodshot, itchy, and have had purulent drainage.   No other systemic symptoms. No neurological symptoms. No known allergies or exposure to sick individuals.  Past Medical History:  Diagnosis Date  . Anal pruritus 02/08/2015  . Anxiety disorder 06/19/2015  . HIV (human immunodeficiency virus infection) ([place])   . HIV disease ([place]) 06/19/2015  . HIV infection ([place])   . 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 PTSD (post-traumatic stress disorder) 06/19/2015    History reviewed. No pertinent surgical history.  History reviewed. No pertinent family history.  Social History   Socioeconomic History  . Marital status: Single    Spouse name: Not on file  . Number of children: Not on file  . Years of education: Not on file  . Highest education level: Not on file  Occupational History  . Not on file  Social Needs  . Financial resource strain: Not on file  . Food insecurity    Worry: Not on file    Inability: Not on file  . Transportation needs    Medical: Not on file    Non-medical: Not on file  Tobacco Use  . Smoking status: Light Tobacco Smoker    Types: Cigarettes  . Smokeless tobacco: Never Used  . Tobacco comment: 1 cigarette daily  Substance and Sexual Activity  . Alcohol use: Yes    Alcohol/week: 0.0 standard drinks  . Drug use: No  . Sexual activity: Yes    Partners: Male    Birth control/protection: Condom    Comment: given condoms  Lifestyle  . Physical activity    Days per week: Not on file    Minutes per session: Not on  file  . Stress: Not on file  Relationships  . Social Herbalist on phone: Not on file    Gets together: Not on file    Attends religious service: Not on file    Active member of club or organization: Not on file    Attends meetings of clubs or organizations: Not on file    Relationship status: Not on file  . Intimate partner violence    Fear of current or ex partner: Not on file    Emotionally abused: Not on file    Physically abused: Not on file    Forced sexual activity: Not on file  Other Topics Concern  . Not on file  Social History Narrative   ** Merged History Encounter **        Outpatient Medications Prior to Visit  Medication Sig Dispense Refill  . calcium carbonate (TUMS - DOSED IN MG ELEMENTAL CALCIUM) 500 MG chewable tablet Chew 1 tablet by mouth daily as needed for indigestion or heartburn.    . elvitegravir-cobicistat-emtricitabine-tenofovir (GENVOYA) 150-150-200-10 MG TABS tablet Take 1 tablet by mouth daily. 30 tablet 11  . Multiple Vitamin (MULTI-VITAMINS) TABS Take by mouth.    . triamcinolone (KENALOG) 0.025 % cream Apply 1 application topically 2 (two) times daily. Do not use in the  rectum. 30 g 0  . valACYclovir (VALTREX) 500 MG tablet TAKE 1 TABLET BY MOUTH  DAILY 90 tablet 5   No facility-administered medications prior to visit.     No Known Allergies  Review of Systems  Constitutional: Negative.   HENT: Negative.   Eyes: Positive for discharge and redness. Negative for blurred vision, double vision, photophobia and pain.  Respiratory: Negative.   Cardiovascular: Negative.   Gastrointestinal: Negative.   Genitourinary: Negative.   Musculoskeletal: Negative.   Skin: Negative.   Neurological: Negative.   Endo/Heme/Allergies: Negative.   Psychiatric/Behavioral: Negative.   All other systems reviewed and are negative.      Objective:    Physical Exam  Constitutional: He is oriented to person, place, and time. He appears  well-developed and well-nourished. No distress.  Eyes: Pupils are equal, round, and reactive to light. EOM are normal. Lids are everted and swept, no foreign bodies found. Right eye exhibits chemosis, discharge and exudate. Right eye exhibits no hordeolum. No foreign body present in the right eye. Left eye exhibits chemosis, discharge and exudate. Left eye exhibits no hordeolum. No foreign body present in the left eye. Right conjunctiva is injected. Right conjunctiva has no hemorrhage. Left conjunctiva is injected. Left conjunctiva has no hemorrhage. No scleral icterus.  Cardiovascular: Normal rate and regular rhythm.  Pulmonary/Chest: Effort normal. No respiratory distress.  Neurological: He is alert and oriented to person, place, and time.  Skin: He is not diaphoretic.  Psychiatric: He has a normal mood and affect. His behavior is normal. Judgment and thought content normal.  Nursing note and vitals reviewed.   BP 137/85   Pulse 73   Temp 98 F (36.7 C) (Oral)   Resp 16   Wt 159 lb (72.1 kg)   SpO2 96%   BMI 23.48 kg/m  Wt Readings from Last 3 Encounters:  04/05/19 159 lb (72.1 kg)  11/05/18 162 lb (73.5 kg)  02/17/18 160 lb (72.6 kg)    There are no preventive care reminders to display for this patient.  There are no preventive care reminders to display for this patient.   No results found for: TSH Lab Results  Component Value Date   WBC 5.0 10/22/2018   HGB 15.7 10/22/2018   HCT 44.8 10/22/2018   MCV 93.5 10/22/2018   PLT 152 10/22/2018   Lab Results  Component Value Date   NA 140 10/22/2018   K 4.3 10/22/2018   CO2 27 10/22/2018   GLUCOSE 141 (H) 10/22/2018   BUN 12 10/22/2018   CREATININE 0.96 10/22/2018   BILITOT 0.4 10/22/2018   ALKPHOS 82 01/13/2017   AST 14 10/22/2018   ALT 17 10/22/2018   PROT 6.6 10/22/2018   ALBUMIN 4.3 01/13/2017   CALCIUM 9.2 10/22/2018   Lab Results  Component Value Date   CHOL 213 (H) 10/22/2018   Lab Results  Component  Value Date   HDL 37 (L) 10/22/2018   Lab Results  Component Value Date   [facility]  10/22/2018     Comment:     . LDL cholesterol not calculated. Triglyceride levels greater than 400 mg/dL invalidate calculated LDL results. . Reference range: <100 . Desirable range <100 mg/dL for primary prevention;   <70 mg/dL for patients with CHD or diabetic patients  with > or = 2 CHD risk factors. Marland Kitchen LDL-C is now calculated using the Martin-Hopkins  calculation, which is a validated novel method providing  better accuracy than the Friedewald equation in the  estimation of LDL-C.  Horald PollenMartin SS et al. Lenox AhrJAMA. 1610;960(452013;310(19): 2061-2068  (http://education.[facility].com/faq/FAQ164)    Lab Results  Component Value Date   TRIG 404 (H) 10/22/2018   Lab Results  Component Value Date   CHOLHDL 5.8 (H) 10/22/2018   No results found for: HGBA1C     Assessment & Plan:   Problem List Items Addressed This Visit    None    Visit Diagnoses    Need for influenza vaccination    -  Primary   Relevant Orders   Influenza (Seasonal) (Completed)   Bacterial conjunctivitis of both eyes       Relevant Medications   erythromycin ophthalmic ointment       Meds ordered this encounter  Medications  . erythromycin ophthalmic ointment    Sig: Place 1 application into both eyes 4 (four) times daily.    Dispense:  10.5 g    Refill:  0    Order Specific Question:   Supervising Provider    Answer:   Doristine BosworthSTALLINGS, ZOE A [id number]]   PLAN  Appears as bacterial conjunctivitis with the purulent drainage and injection.   Erythromycin ophthalmic ointment qid for 3-4 days  Work note given  ER precautions given  Patient encouraged to call clinic with any questions, comments, or concerns.    Janeece Ageeichard Cheryl Stabenow, NP

## 2019-04-09 ENCOUNTER — Encounter: Payer: Self-pay | Admitting: Registered Nurse

## 2019-04-09 ENCOUNTER — Telehealth: Payer: Self-pay | Admitting: Registered Nurse

## 2019-04-09 NOTE — Telephone Encounter (Signed)
Note has been created and given to pt, advised to follow-up if his symptoms are not better by Monday. He verbalized understanding.

## 2019-04-09 NOTE — Telephone Encounter (Signed)
Pt goes in to work @ 3:00 today and his pink eye has not gotten better , Employer needs another note if dr.morrow feels like he is still contagious   Please advise

## 2019-05-11 ENCOUNTER — Telehealth: Payer: Self-pay

## 2019-05-11 NOTE — Telephone Encounter (Signed)
Reaching out to patient to determine if [facility] has the correct number on file in order to ensure patient is able to continue to receive Genvoya. Received notification that they have been unable to reach patient. Unable to leave VM. Will try again later.   Wadie Liew Lorita Officer, RN

## 2019-09-10 ENCOUNTER — Other Ambulatory Visit: Payer: Self-pay | Admitting: Infectious Disease

## 2019-09-10 DIAGNOSIS — B20 Human immunodeficiency virus [HIV] disease: Secondary | ICD-10-CM

## 2019-09-23 ENCOUNTER — Encounter (HOSPITAL_COMMUNITY): Payer: Self-pay | Admitting: Emergency Medicine

## 2019-09-23 ENCOUNTER — Emergency Department (HOSPITAL_COMMUNITY)
Admission: EM | Admit: 2019-09-23 | Discharge: 2019-09-23 | Disposition: A | Discharge status: Home / Self Care | Payer: Managed Care, Other (non HMO) | Attending: Emergency Medicine | Admitting: Emergency Medicine

## 2019-09-23 ENCOUNTER — Emergency Department (HOSPITAL_COMMUNITY): Payer: Managed Care, Other (non HMO)

## 2019-09-23 ENCOUNTER — Other Ambulatory Visit: Payer: Self-pay

## 2019-09-23 DIAGNOSIS — Z79899 Other long term (current) drug therapy: Secondary | ICD-10-CM | POA: Diagnosis not present

## 2019-09-23 DIAGNOSIS — Y999 Unspecified external cause status: Secondary | ICD-10-CM | POA: Diagnosis not present

## 2019-09-23 DIAGNOSIS — Y9289 Other specified places as the place of occurrence of the external cause: Secondary | ICD-10-CM | POA: Diagnosis not present

## 2019-09-23 DIAGNOSIS — S6991XA Unspecified injury of right wrist, hand and finger(s), initial encounter: Secondary | ICD-10-CM | POA: Diagnosis present

## 2019-09-23 DIAGNOSIS — W01110A Fall on same level from slipping, tripping and stumbling with subsequent striking against sharp glass, initial encounter: Secondary | ICD-10-CM | POA: Diagnosis not present

## 2019-09-23 DIAGNOSIS — S60511A Abrasion of right hand, initial encounter: Secondary | ICD-10-CM | POA: Insufficient documentation

## 2019-09-23 DIAGNOSIS — F17210 Nicotine dependence, cigarettes, uncomplicated: Secondary | ICD-10-CM | POA: Diagnosis not present

## 2019-09-23 DIAGNOSIS — Y9389 Activity, other specified: Secondary | ICD-10-CM | POA: Insufficient documentation

## 2019-09-23 DIAGNOSIS — Z21 Asymptomatic human immunodeficiency virus [HIV] infection status: Secondary | ICD-10-CM | POA: Insufficient documentation

## 2019-09-23 DIAGNOSIS — S61419A Laceration without foreign body of unspecified hand, initial encounter: Secondary | ICD-10-CM
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
## 2019-09-23 NOTE — Discharge Instructions (Signed)
Keep right hand cleaned with soap and warm water. I would apply dressing over wound while working. Follow-up with your primary care doctor. Return here for any new/acute changes.

## 2019-10-26 ENCOUNTER — Other Ambulatory Visit: Payer: Self-pay | Admitting: Infectious Disease

## 2019-10-26 DIAGNOSIS — B20 Human immunodeficiency virus [HIV] disease: Secondary | ICD-10-CM

## 2019-11-09 ENCOUNTER — Other Ambulatory Visit: Payer: Managed Care, Other (non HMO)

## 2019-11-15 ENCOUNTER — Other Ambulatory Visit: Payer: Managed Care, Other (non HMO)

## 2019-11-15 ENCOUNTER — Other Ambulatory Visit: Payer: Self-pay

## 2019-11-15 DIAGNOSIS — B20 Human immunodeficiency virus [HIV] disease: Secondary | ICD-10-CM

## 2019-11-16 LAB — T-HELPER CELL (CD4) - (RCID CLINIC ONLY)
CD4 % Helper T Cell: 32 % — ABNORMAL LOW (ref 33–65)
CD4 T Cell Abs: 441 /uL (ref 400–1790)

## 2019-11-17 LAB — CBC WITH DIFFERENTIAL/PLATELET
Absolute Monocytes: 686 cells/uL (ref 200–950)
Basophils Absolute: 31 cells/uL (ref 0–200)
Basophils Relative: 0.4 %
Eosinophils Absolute: 70 cells/uL (ref 15–500)
Eosinophils Relative: 0.9 %
HCT: 42.4 % (ref 38.5–50.0)
Hemoglobin: 14.9 g/dL (ref 13.2–17.1)
Lymphs Abs: 1381 cells/uL (ref 850–3900)
MCH: 33.6 pg — ABNORMAL HIGH (ref 27.0–33.0)
MCHC: 35.1 g/dL (ref 32.0–36.0)
MCV: 95.7 fL (ref 80.0–100.0)
MPV: 11.6 fL (ref 7.5–12.5)
Monocytes Relative: 8.8 %
Neutro Abs: 5632 cells/uL (ref 1500–7800)
Neutrophils Relative %: 72.2 %
Platelets: 193 10*3/uL (ref 140–400)
RBC: 4.43 10*6/uL (ref 4.20–5.80)
RDW: 12.6 % (ref 11.0–15.0)
Total Lymphocyte: 17.7 %
WBC: 7.8 10*3/uL (ref 3.8–10.8)

## 2019-11-17 LAB — COMPLETE METABOLIC PANEL WITH GFR
AG Ratio: 1.6 (calc) (ref 1.0–2.5)
ALT: 19 U/L (ref 9–46)
AST: 14 U/L (ref 10–40)
Albumin: 4.4 g/dL (ref 3.6–5.1)
Alkaline phosphatase (APISO): 70 U/L (ref 36–130)
BUN: 16 mg/dL (ref 7–25)
CO2: 22 mmol/L (ref 20–32)
Calcium: 9.4 mg/dL (ref 8.6–10.3)
Chloride: 101 mmol/L (ref 98–110)
Creat: 1.11 mg/dL (ref 0.60–1.35)
GFR, Est African American: 90 mL/min/{1.73_m2} (ref >=60)
GFR, Est Non African American: 78 mL/min/{1.73_m2} (ref >=60)
Globulin: 2.7 g/dL (calc) (ref 1.9–3.7)
Glucose, Bld: 241 mg/dL — ABNORMAL HIGH (ref 65–99)
Potassium: 4 mmol/L (ref 3.5–5.3)
Sodium: 136 mmol/L (ref 135–146)
Total Bilirubin: 0.7 mg/dL (ref 0.2–1.2)
Total Protein: 7.1 g/dL (ref 6.1–8.1)

## 2019-11-17 LAB — LIPID PANEL
Cholesterol: 280 mg/dL — ABNORMAL HIGH (ref <200)
HDL: 48 mg/dL (ref >=40)
Non-HDL Cholesterol (Calc): 232 mg/dL (calc) — ABNORMAL HIGH (ref <130)
Total CHOL/HDL Ratio: 5.8 (calc) — ABNORMAL HIGH (ref <5.0)
Triglycerides: 407 mg/dL — ABNORMAL HIGH (ref <150)

## 2019-11-17 LAB — HIV-1 RNA QUANT-NO REFLEX-BLD
HIV 1 RNA Quant: 24 copies/mL — ABNORMAL HIGH
HIV-1 RNA Quant, Log: 1.38 Log copies/mL — ABNORMAL HIGH

## 2019-11-17 LAB — RPR: RPR Ser Ql: NONREACTIVE

## 2019-11-23 ENCOUNTER — Encounter: Payer: Managed Care, Other (non HMO) | Admitting: Infectious Disease

## 2019-12-15 ENCOUNTER — Other Ambulatory Visit: Payer: Managed Care, Other (non HMO)

## 2020-01-03 ENCOUNTER — Other Ambulatory Visit: Payer: Self-pay | Admitting: Infectious Disease

## 2020-01-03 DIAGNOSIS — A630 Anogenital (venereal) warts: Secondary | ICD-10-CM

## 2020-01-03 DIAGNOSIS — B20 Human immunodeficiency virus [HIV] disease: Secondary | ICD-10-CM

## 2020-01-04 ENCOUNTER — Other Ambulatory Visit: Payer: Self-pay

## 2020-01-04 ENCOUNTER — Encounter: Payer: Self-pay | Admitting: Infectious Disease

## 2020-01-04 ENCOUNTER — Telehealth (INDEPENDENT_AMBULATORY_CARE_PROVIDER_SITE_OTHER): Payer: Managed Care, Other (non HMO) | Admitting: Infectious Disease

## 2020-01-04 DIAGNOSIS — B20 Human immunodeficiency virus [HIV] disease: Secondary | ICD-10-CM

## 2020-01-04 DIAGNOSIS — R739 Hyperglycemia, unspecified: Secondary | ICD-10-CM | POA: Diagnosis not present

## 2020-01-04 HISTORY — DX: Hyperglycemia, unspecified: R73.9

## 2020-01-04 MED ORDER — GENVOYA 150-150-200-10 MG PO TABS: 1.0000 | ORAL_TABLET | Freq: Every day | ORAL | 11 refills | Status: AC

## 2020-01-04 NOTE — Progress Notes (Signed)
Virtual Visit via Video Note  I connected with Ricky Holt on 01/04/20 at 10:30 AM EDT by a video enabled telemedicine application and verified that I am speaking with the correct person using two identifiers.  Location: Patient: Home Provider: [facility]   I discussed the limitations of evaluation and management by telemedicine and the availability of in person appointments. The patient expressed understanding and agreed to proceed.  History of Present Illness: Ricky Holt is a 49 year old Latino man living with HIV that is been perfectly controlled on Genvoya.  He is in the process of moving to [place] needs to find a provider there to take care of his HIV.  He has been vaccinated against COVID-19 with 2 shots.  I did notice that some of his labs showed some elevated blood sugars and I wonder about mature onset diabetes.   Past Medical History:  Diagnosis Date  . Anal pruritus 02/08/2015  . Anxiety disorder 06/19/2015  . HIV (human immunodeficiency virus infection) (HCC)   . HIV disease (HCC) 06/19/2015  . HIV infection (HCC)   . Hyperglycemia 01/04/2020  . 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 PTSD (post-traumatic stress disorder) 06/19/2015    No past surgical history on file.  No family history on file.    Social History   Socioeconomic History  . Marital status: Single    Spouse name: Not on file  . Number of children: Not on file  . Years of education: Not on file  . Highest education level: Not on file  Occupational History  . Not on file  Tobacco Use  . Smoking status: Light Tobacco Smoker    Types: Cigarettes  . Smokeless tobacco: Never Used  . Tobacco comment: 1 cigarette daily  Substance and Sexual Activity  . Alcohol use: Yes    Alcohol/week: 0.0 standard drinks  . Drug use: No  . Sexual activity: Yes    Partners: Male    Birth control/protection: Condom    Comment: given condoms  Other Topics Concern  . Not on file  Social History  Narrative   ** Merged History Encounter **       Social Determinants of Health   Financial Resource Strain:   . Difficulty of Paying Living Expenses:   Food Insecurity:   . Worried About Programme researcher, broadcasting/film/video in the Last Year:   . Barista in the Last Year:   Transportation Needs:   . Freight forwarder (Medical):   Marland Kitchen Lack of Transportation (Non-Medical):   Physical Activity:   . Days of Exercise per Week:   . Minutes of Exercise per Session:   Stress:   . Feeling of Stress :   Social Connections:   . Frequency of Communication with Friends and Family:   . Frequency of Social Gatherings with Friends and Family:   . Attends Religious Services:   . Active Member of Clubs or Organizations:   . Attends Banker Meetings:   Marland Kitchen Marital Status:     No Known Allergies   Current Outpatient Medications:  .  calcium carbonate (TUMS - DOSED IN MG ELEMENTAL CALCIUM) 500 MG chewable tablet, Chew 1 tablet by mouth daily as needed for indigestion or heartburn., Disp: , Rfl:  .  Calcium Polycarbophil (FIBER) 625 MG TABS, , Disp: , Rfl:  .  elvitegravir-cobicistat-emtricitabine-tenofovir (GENVOYA) 150-150-200-10 MG TABS tablet, Take 1 tablet by mouth daily., Disp: 30 tablet, Rfl: 11 .  Multiple Vitamin (MULTI-VITAMINS) TABS, Take by mouth., Disp: ,  Rfl:  .  valACYclovir (VALTREX) 500 MG tablet, TAKE 1 TABLET BY MOUTH  DAILY, Disp: 90 tablet, Rfl: 3 .  erythromycin ophthalmic ointment, Place 1 application into both eyes 4 (four) times daily., Disp: 10.5 g, Rfl: 0 .  triamcinolone (KENALOG) 0.025 % cream, Apply 1 application topically 2 (two) times daily. Do not use in the rectum., Disp: 30 g, Rfl: 0    Observations/Objective:  [name] was quite comfortable and seemed to be healthy and having no issues whatsoever he showed me what bottles of Genvoya he had left which is about a bottle and a half  Assessment and Plan:  HIV disease: Continue Genvoya and I sent in refills to  his mail order pharmacy he should see a provider at one of the clinics in [facility]   Options could include:  [employer] [facility] (NP's [place], [place] and Dr. Jola Schmidt and Dr. Janann August   Hyperglycemia would recommend him having an A1c checked when he  establishes care there [facility] Follow Up Instructions:    I discussed the assessment and treatment plan with the patient. The patient was provided an opportunity to ask questions and all were answered. The patient agreed with the plan and demonstrated an understanding of the instructions.   The patient was advised to call back or seek an in-person evaluation if the symptoms worsen or if the condition fails to improve as anticipated.   Acey Lav, MD

## 2020-06-26 ENCOUNTER — Telehealth: Payer: Self-pay | Admitting: *Deleted

## 2020-06-26 NOTE — Telephone Encounter (Signed)
Ricky Holt and his partner Ricky Holt called for an update on transfer of care. Per Ricky Holt, [facility] is still waiting on chart to be sent to them.   RN confirmed address, contact information, and emergency contact preference.

## 2020-06-27 NOTE — Telephone Encounter (Signed)
Found medical record request at front desk. Faxed last office note, lab, medication list, immunizations to [phone].  Form replaced at front for rest of chart to follow. Andree Coss, RN

## 2021-02-22 IMAGING — CR DG HAND COMPLETE 3+V*R*
4 series · 4 of 4 positions shown · non-contrast
Comparison: None.

CLINICAL DATA: 49-year-old male with concern for foreign object.

EXAM:
RIGHT HAND - COMPLETE 3+ VIEW

[hand pa]
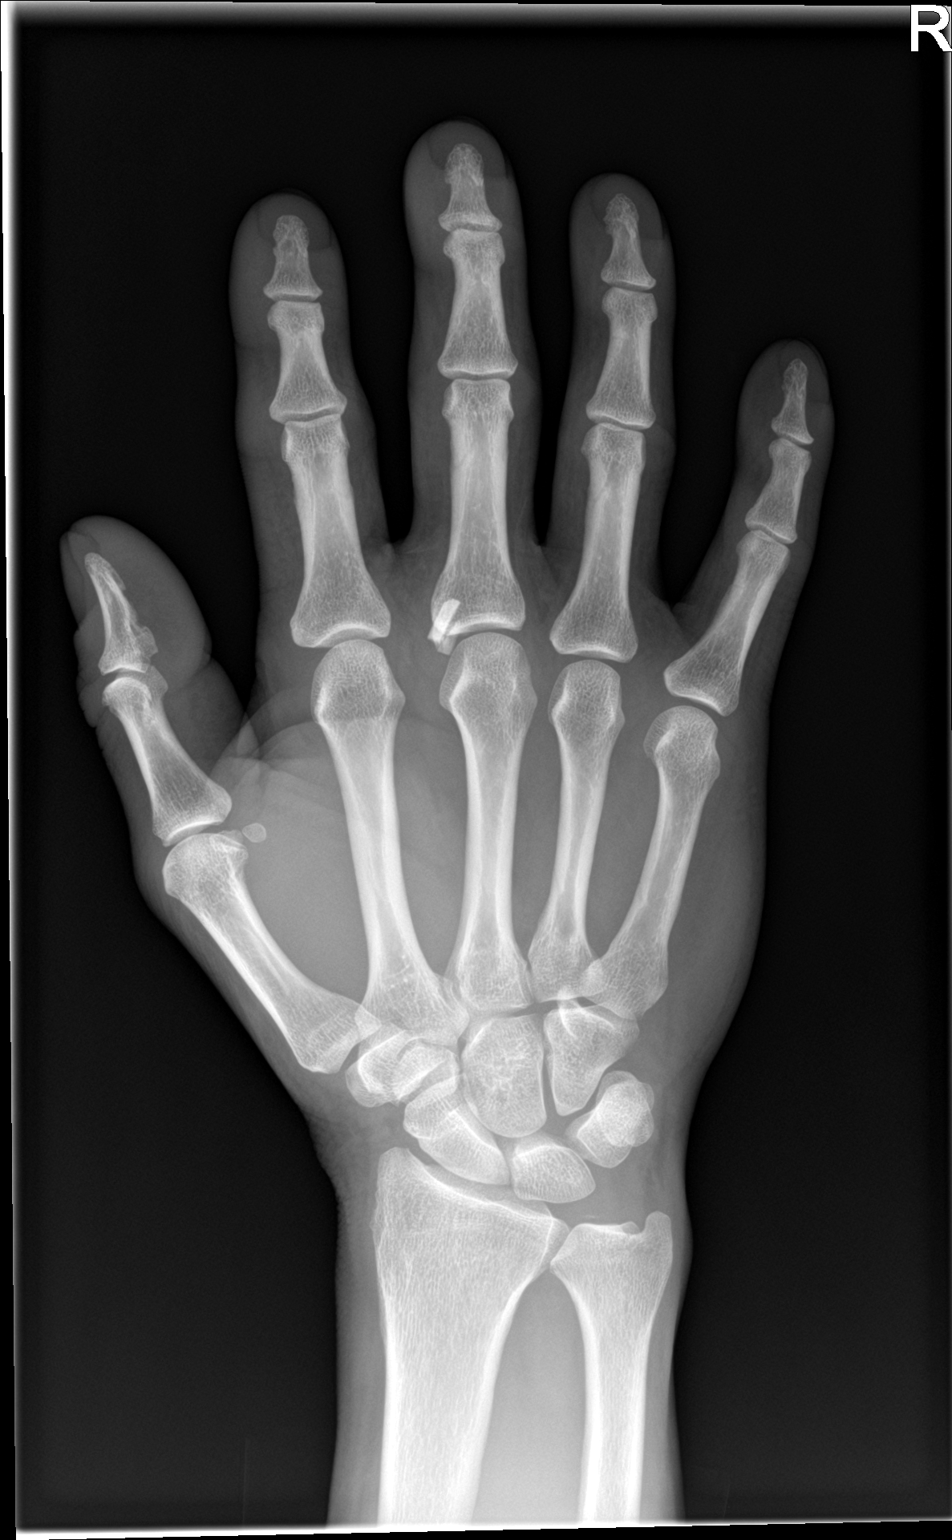

[hand obl]
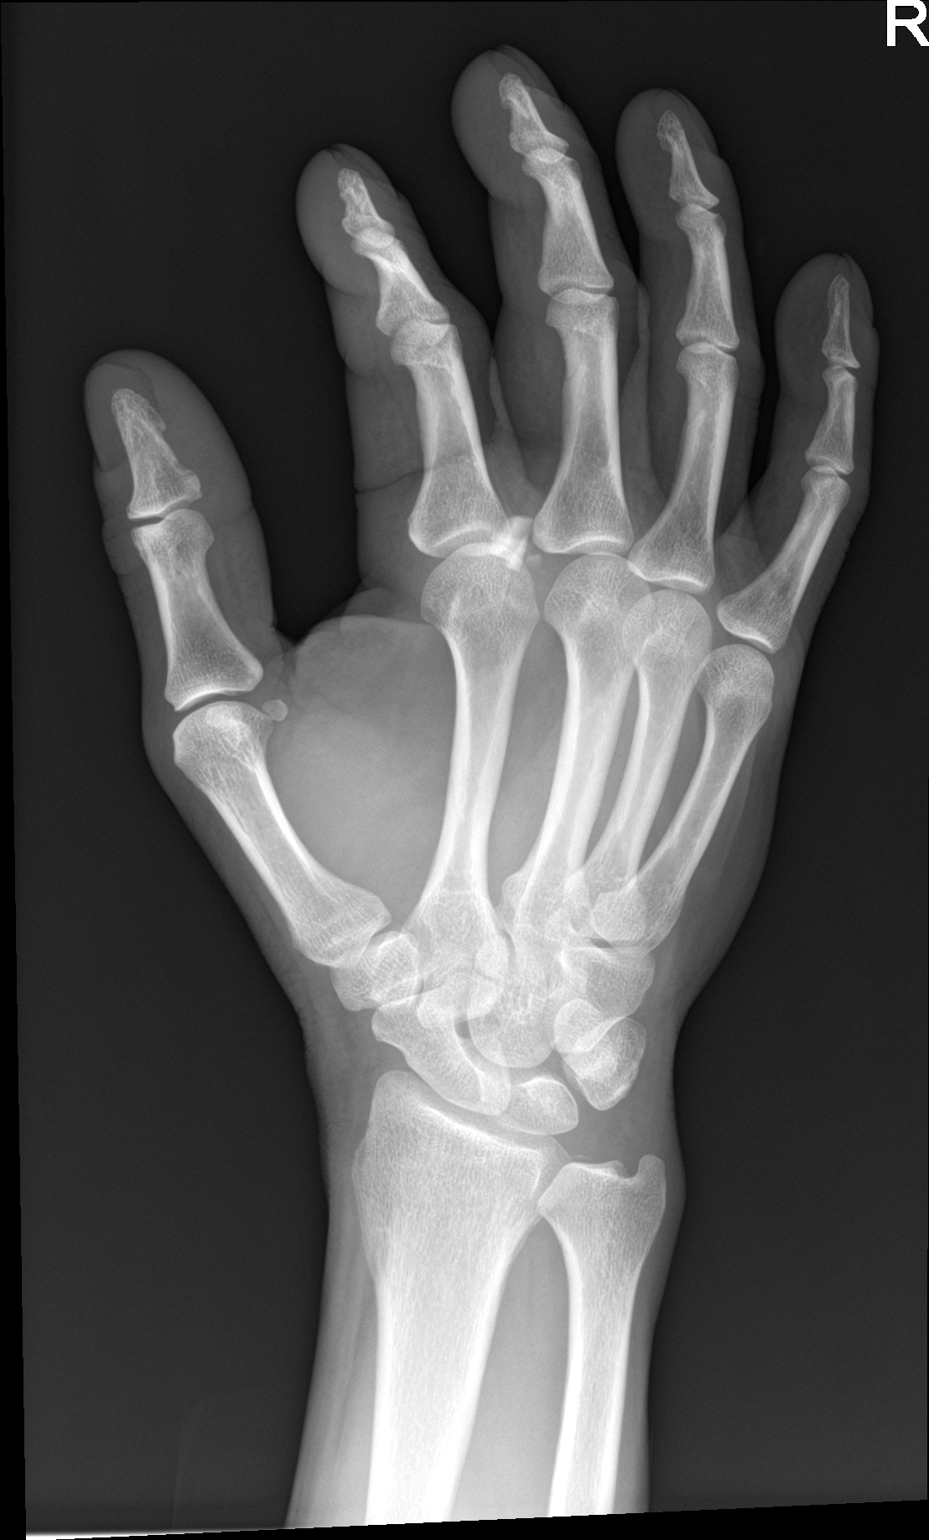

[hand lat (1 of 2)]
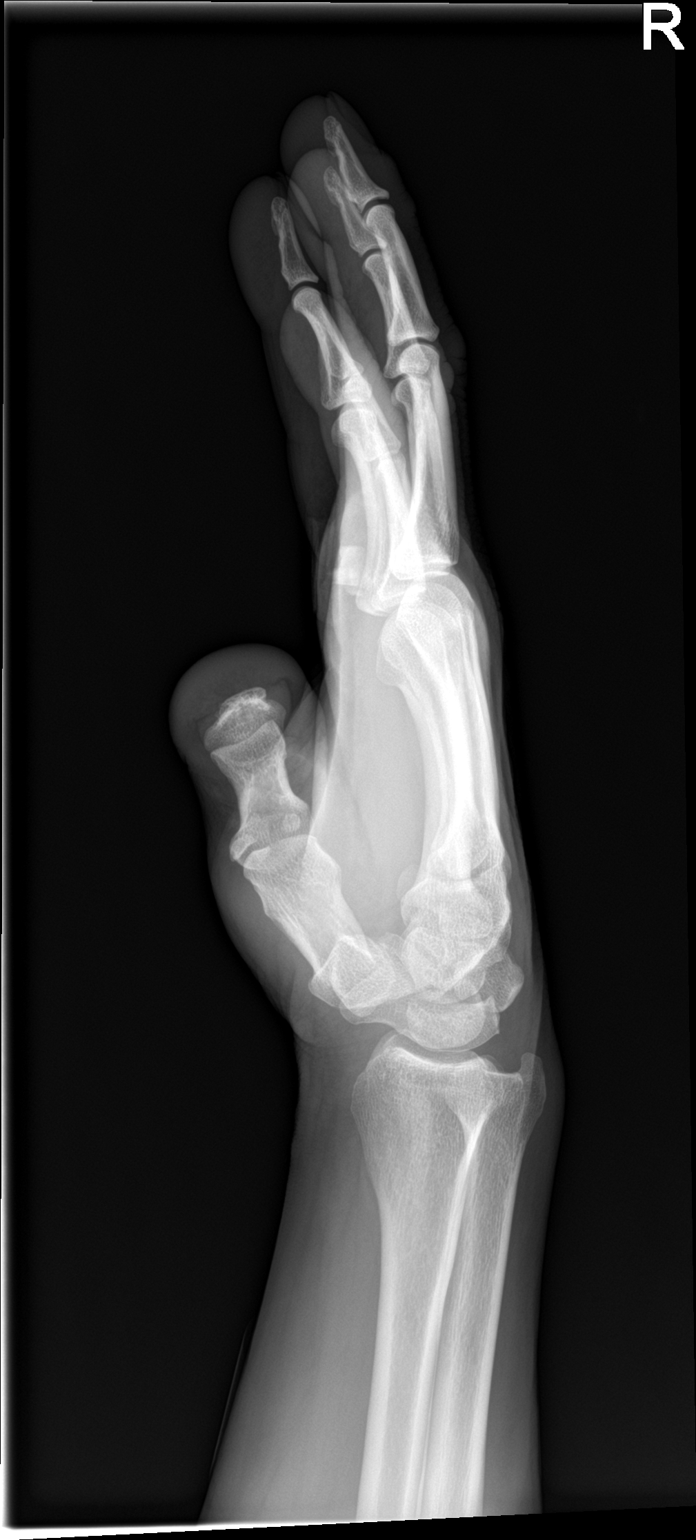

[hand lat (2 of 2)]
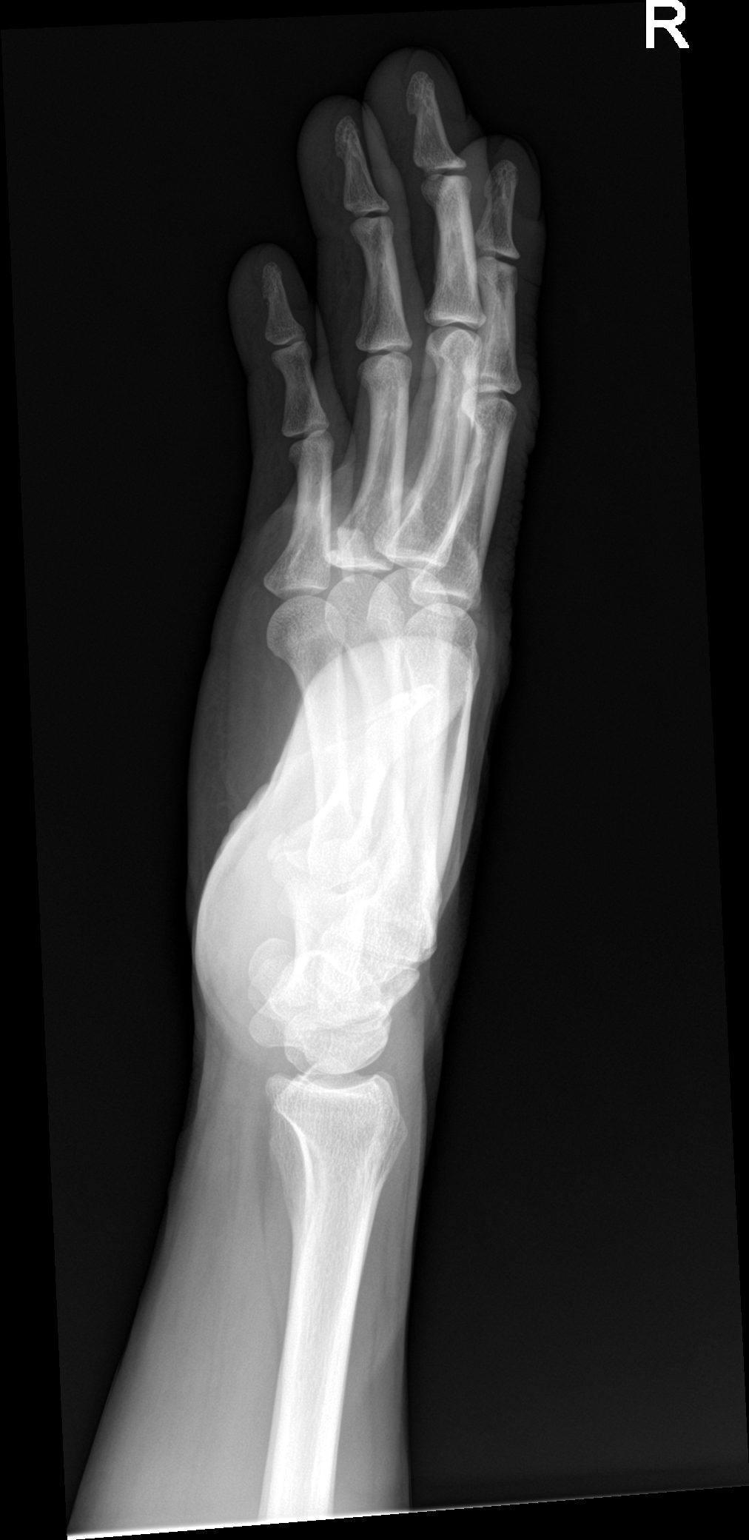

[4 of 4 positions shown; findings below may reference images not displayed]

FINDINGS: There is no acute fracture or dislocation. The bones are well
mineralized. No arthritic changes. There is a 7 x 5 mm rectangular
foreign object in the soft tissues of the plantar aspect of the hand
at the level of the base of the proximal phalanx of the third digit.
An additional smaller foreign fragment is suspected adjacent to this
rectangular fragment.
IMPRESSION: 1. Radiopaque foreign object in the soft tissues of the plantar
aspect of the hand.
2. No acute fracture or dislocation.
